# Patient Record
Sex: Female | Born: 1965 | Race: Black or African American | Hispanic: No | Marital: Single | Smoking: Never smoker
Health system: Southern US, Community
[De-identification: ages and names within clinical notes are randomized; demographics above are authoritative.]

## PROBLEM LIST (undated history)

## (undated) DIAGNOSIS — J45909 Unspecified asthma, uncomplicated: Secondary | ICD-10-CM

---

## 2019-09-23 ENCOUNTER — Encounter (HOSPITAL_COMMUNITY): Payer: Self-pay | Admitting: Obstetrics and Gynecology

## 2019-09-23 ENCOUNTER — Inpatient Hospital Stay (HOSPITAL_COMMUNITY)
Admission: EM | Admit: 2019-09-23 | Discharge: 2019-11-03 | DRG: 207 | Disposition: E | Payer: No Typology Code available for payment source | Attending: Specialist | Admitting: Specialist

## 2019-09-23 ENCOUNTER — Inpatient Hospital Stay (HOSPITAL_COMMUNITY): Payer: No Typology Code available for payment source

## 2019-09-23 ENCOUNTER — Emergency Department (HOSPITAL_COMMUNITY): Payer: No Typology Code available for payment source

## 2019-09-23 ENCOUNTER — Other Ambulatory Visit: Payer: Self-pay

## 2019-09-23 DIAGNOSIS — Z781 Physical restraint status: Secondary | ICD-10-CM

## 2019-09-23 DIAGNOSIS — I444 Left anterior fascicular block: Secondary | ICD-10-CM | POA: Diagnosis present

## 2019-09-23 DIAGNOSIS — J310 Chronic rhinitis: Secondary | ICD-10-CM | POA: Diagnosis present

## 2019-09-23 DIAGNOSIS — R7402 Elevation of levels of lactic acid dehydrogenase (LDH): Secondary | ICD-10-CM | POA: Diagnosis not present

## 2019-09-23 DIAGNOSIS — D6489 Other specified anemias: Secondary | ICD-10-CM | POA: Diagnosis present

## 2019-09-23 DIAGNOSIS — J80 Acute respiratory distress syndrome: Secondary | ICD-10-CM | POA: Diagnosis present

## 2019-09-23 DIAGNOSIS — R042 Hemoptysis: Secondary | ICD-10-CM | POA: Diagnosis not present

## 2019-09-23 DIAGNOSIS — I10 Essential (primary) hypertension: Secondary | ICD-10-CM | POA: Diagnosis present

## 2019-09-23 DIAGNOSIS — E876 Hypokalemia: Secondary | ICD-10-CM | POA: Diagnosis not present

## 2019-09-23 DIAGNOSIS — R7989 Other specified abnormal findings of blood chemistry: Secondary | ICD-10-CM | POA: Diagnosis present

## 2019-09-23 DIAGNOSIS — I469 Cardiac arrest, cause unspecified: Secondary | ICD-10-CM | POA: Diagnosis not present

## 2019-09-23 DIAGNOSIS — U071 COVID-19: Secondary | ICD-10-CM | POA: Diagnosis present

## 2019-09-23 DIAGNOSIS — E162 Hypoglycemia, unspecified: Secondary | ICD-10-CM | POA: Diagnosis not present

## 2019-09-23 DIAGNOSIS — J95860 Postprocedural hematoma of a respiratory system organ or structure following a respiratory system procedure: Secondary | ICD-10-CM | POA: Diagnosis not present

## 2019-09-23 DIAGNOSIS — D72819 Decreased white blood cell count, unspecified: Secondary | ICD-10-CM | POA: Diagnosis present

## 2019-09-23 DIAGNOSIS — I2699 Other pulmonary embolism without acute cor pulmonale: Secondary | ICD-10-CM | POA: Diagnosis not present

## 2019-09-23 DIAGNOSIS — R1310 Dysphagia, unspecified: Secondary | ICD-10-CM | POA: Diagnosis not present

## 2019-09-23 DIAGNOSIS — J9601 Acute respiratory failure with hypoxia: Secondary | ICD-10-CM

## 2019-09-23 DIAGNOSIS — I4891 Unspecified atrial fibrillation: Secondary | ICD-10-CM | POA: Diagnosis present

## 2019-09-23 DIAGNOSIS — R061 Stridor: Secondary | ICD-10-CM | POA: Diagnosis not present

## 2019-09-23 DIAGNOSIS — J45909 Unspecified asthma, uncomplicated: Secondary | ICD-10-CM | POA: Diagnosis present

## 2019-09-23 DIAGNOSIS — E1165 Type 2 diabetes mellitus with hyperglycemia: Secondary | ICD-10-CM | POA: Diagnosis not present

## 2019-09-23 DIAGNOSIS — Z6841 Body Mass Index (BMI) 40.0 and over, adult: Secondary | ICD-10-CM | POA: Diagnosis not present

## 2019-09-23 DIAGNOSIS — Z01818 Encounter for other preprocedural examination: Secondary | ICD-10-CM

## 2019-09-23 DIAGNOSIS — E119 Type 2 diabetes mellitus without complications: Secondary | ICD-10-CM | POA: Diagnosis not present

## 2019-09-23 DIAGNOSIS — Z9911 Dependence on respirator [ventilator] status: Secondary | ICD-10-CM | POA: Diagnosis not present

## 2019-09-23 DIAGNOSIS — D72829 Elevated white blood cell count, unspecified: Secondary | ICD-10-CM | POA: Diagnosis not present

## 2019-09-23 DIAGNOSIS — Y849 Medical procedure, unspecified as the cause of abnormal reaction of the patient, or of later complication, without mention of misadventure at the time of the procedure: Secondary | ICD-10-CM | POA: Diagnosis not present

## 2019-09-23 DIAGNOSIS — Z79899 Other long term (current) drug therapy: Secondary | ICD-10-CM | POA: Diagnosis not present

## 2019-09-23 DIAGNOSIS — F419 Anxiety disorder, unspecified: Secondary | ICD-10-CM | POA: Diagnosis present

## 2019-09-23 DIAGNOSIS — K219 Gastro-esophageal reflux disease without esophagitis: Secondary | ICD-10-CM | POA: Diagnosis present

## 2019-09-23 DIAGNOSIS — J1282 Pneumonia due to coronavirus disease 2019: Secondary | ICD-10-CM | POA: Diagnosis present

## 2019-09-23 DIAGNOSIS — I959 Hypotension, unspecified: Secondary | ICD-10-CM | POA: Diagnosis not present

## 2019-09-23 DIAGNOSIS — R410 Disorientation, unspecified: Secondary | ICD-10-CM | POA: Diagnosis not present

## 2019-09-23 DIAGNOSIS — E860 Dehydration: Secondary | ICD-10-CM | POA: Diagnosis present

## 2019-09-23 DIAGNOSIS — R7401 Elevation of levels of liver transaminase levels: Secondary | ICD-10-CM | POA: Diagnosis not present

## 2019-09-23 DIAGNOSIS — R0602 Shortness of breath: Secondary | ICD-10-CM | POA: Diagnosis not present

## 2019-09-23 DIAGNOSIS — J9690 Respiratory failure, unspecified, unspecified whether with hypoxia or hypercapnia: Secondary | ICD-10-CM

## 2019-09-23 DIAGNOSIS — R0902 Hypoxemia: Secondary | ICD-10-CM

## 2019-09-23 DIAGNOSIS — R7303 Prediabetes: Secondary | ICD-10-CM | POA: Diagnosis present

## 2019-09-23 DIAGNOSIS — R739 Hyperglycemia, unspecified: Secondary | ICD-10-CM | POA: Diagnosis not present

## 2019-09-23 DIAGNOSIS — Z7901 Long term (current) use of anticoagulants: Secondary | ICD-10-CM | POA: Diagnosis not present

## 2019-09-23 DIAGNOSIS — Z452 Encounter for adjustment and management of vascular access device: Secondary | ICD-10-CM

## 2019-09-23 DIAGNOSIS — R58 Hemorrhage, not elsewhere classified: Secondary | ICD-10-CM | POA: Diagnosis not present

## 2019-09-23 DIAGNOSIS — J189 Pneumonia, unspecified organism: Secondary | ICD-10-CM

## 2019-09-23 DIAGNOSIS — J9600 Acute respiratory failure, unspecified whether with hypoxia or hypercapnia: Secondary | ICD-10-CM

## 2019-09-23 DIAGNOSIS — Z8249 Family history of ischemic heart disease and other diseases of the circulatory system: Secondary | ICD-10-CM

## 2019-09-23 DIAGNOSIS — Z978 Presence of other specified devices: Secondary | ICD-10-CM

## 2019-09-23 HISTORY — DX: Unspecified asthma, uncomplicated: J45.909

## 2019-09-23 LAB — URINALYSIS, ROUTINE W REFLEX MICROSCOPIC
Bacteria, UA: NONE SEEN
Bilirubin Urine: NEGATIVE
Glucose, UA: NEGATIVE mg/dL
Ketones, ur: 5 mg/dL — AB
Leukocytes,Ua: NEGATIVE
Nitrite: NEGATIVE
Protein, ur: >=300 mg/dL — AB
Specific Gravity, Urine: 1.03 (ref 1.005–1.030)
pH: 6 (ref 5.0–8.0)

## 2019-09-23 LAB — BASIC METABOLIC PANEL
Anion gap: 12 (ref 5–15)
BUN: 15 mg/dL (ref 6–20)
CO2: 21 mmol/L — ABNORMAL LOW (ref 22–32)
Calcium: 8.2 mg/dL — ABNORMAL LOW (ref 8.9–10.3)
Chloride: 105 mmol/L (ref 98–111)
Creatinine, Ser: 0.65 mg/dL (ref 0.44–1.00)
GFR calc Af Amer: >60 mL/min (ref >60)
GFR calc non Af Amer: >60 mL/min (ref >60)
Glucose, Bld: 138 mg/dL — ABNORMAL HIGH (ref 70–99)
Potassium: 3.6 mmol/L (ref 3.5–5.1)
Sodium: 138 mmol/L (ref 135–145)

## 2019-09-23 LAB — CBC WITH DIFFERENTIAL/PLATELET
Abs Immature Granulocytes: 0.04 10*3/uL (ref 0.00–0.07)
Basophils Absolute: 0 10*3/uL (ref 0.0–0.1)
Basophils Relative: 0 %
Eosinophils Absolute: 0 10*3/uL (ref 0.0–0.5)
Eosinophils Relative: 0 %
HCT: 46.5 % — ABNORMAL HIGH (ref 36.0–46.0)
Hemoglobin: 14.5 g/dL (ref 12.0–15.0)
Immature Granulocytes: 1 %
Lymphocytes Relative: 17 %
Lymphs Abs: 1 10*3/uL (ref 0.7–4.0)
MCH: 20.9 pg — ABNORMAL LOW (ref 26.0–34.0)
MCHC: 31.2 g/dL (ref 30.0–36.0)
MCV: 67 fL — ABNORMAL LOW (ref 80.0–100.0)
Monocytes Absolute: 0.5 10*3/uL (ref 0.1–1.0)
Monocytes Relative: 8 %
Neutro Abs: 4.3 10*3/uL (ref 1.7–7.7)
Neutrophils Relative %: 74 %
Platelets: 229 10*3/uL (ref 150–400)
RBC: 6.94 MIL/uL — ABNORMAL HIGH (ref 3.87–5.11)
RDW: 18.4 % — ABNORMAL HIGH (ref 11.5–15.5)
WBC: 5.9 10*3/uL (ref 4.0–10.5)
nRBC: 0 % (ref 0.0–0.2)

## 2019-09-23 LAB — RETICULOCYTES
Immature Retic Fract: 9.5 % (ref 2.3–15.9)
RBC.: 6.63 MIL/uL — ABNORMAL HIGH (ref 3.87–5.11)
Retic Count, Absolute: 41.1 10*3/uL (ref 19.0–186.0)
Retic Ct Pct: 0.6 % (ref 0.4–3.1)

## 2019-09-23 LAB — COMPREHENSIVE METABOLIC PANEL
ALT: 46 U/L — ABNORMAL HIGH (ref 0–44)
AST: 77 U/L — ABNORMAL HIGH (ref 15–41)
Albumin: 3.3 g/dL — ABNORMAL LOW (ref 3.5–5.0)
Alkaline Phosphatase: 70 U/L (ref 38–126)
Anion gap: 16 — ABNORMAL HIGH (ref 5–15)
BUN: 17 mg/dL (ref 6–20)
CO2: 21 mmol/L — ABNORMAL LOW (ref 22–32)
Calcium: 8.7 mg/dL — ABNORMAL LOW (ref 8.9–10.3)
Chloride: 102 mmol/L (ref 98–111)
Creatinine, Ser: 0.88 mg/dL (ref 0.44–1.00)
GFR calc Af Amer: >60 mL/min (ref >60)
GFR calc non Af Amer: >60 mL/min (ref >60)
Glucose, Bld: 202 mg/dL — ABNORMAL HIGH (ref 70–99)
Potassium: 3.1 mmol/L — ABNORMAL LOW (ref 3.5–5.1)
Sodium: 139 mmol/L (ref 135–145)
Total Bilirubin: 0.6 mg/dL (ref 0.3–1.2)
Total Protein: 7.5 g/dL (ref 6.5–8.1)

## 2019-09-23 LAB — D-DIMER, QUANTITATIVE: D-Dimer, Quant: 1.08 ug/mL-FEU — ABNORMAL HIGH (ref 0.00–0.50)

## 2019-09-23 LAB — LACTIC ACID, PLASMA
Lactic Acid, Venous: 2.6 mmol/L (ref 0.5–1.9)
Lactic Acid, Venous: 1.4 mmol/L (ref 0.5–1.9)

## 2019-09-23 LAB — PROTIME-INR
INR: 1 (ref 0.8–1.2)
Prothrombin Time: 13.1 seconds (ref 11.4–15.2)

## 2019-09-23 LAB — TROPONIN I (HIGH SENSITIVITY)
Troponin I (High Sensitivity): 15 ng/L (ref <18)
Troponin I (High Sensitivity): 17 ng/L (ref <18)

## 2019-09-23 LAB — FOLATE: Folate: 28.8 ng/mL (ref >5.9)

## 2019-09-23 LAB — POC SARS CORONAVIRUS 2 AG -  ED: SARS Coronavirus 2 Ag: POSITIVE — AB

## 2019-09-23 LAB — VITAMIN B12: Vitamin B-12: 5996 pg/mL — ABNORMAL HIGH (ref 180–914)

## 2019-09-23 LAB — FERRITIN: Ferritin: 282 ng/mL (ref 11–307)

## 2019-09-23 LAB — IRON AND TIBC
Iron: 17 ug/dL — ABNORMAL LOW (ref 28–170)
Saturation Ratios: 5 % — ABNORMAL LOW (ref 10.4–31.8)
TIBC: 331 ug/dL (ref 250–450)
UIBC: 314 ug/dL

## 2019-09-23 LAB — HCG, QUANTITATIVE, PREGNANCY: hCG, Beta Chain, Quant, S: <1 m[IU]/mL (ref <5)

## 2019-09-23 LAB — HEPATITIS B SURFACE ANTIGEN: Hepatitis B Surface Ag: NONREACTIVE

## 2019-09-23 LAB — ECHOCARDIOGRAM COMPLETE
Height: 66 in
Weight: 4832 oz

## 2019-09-23 LAB — FIBRINOGEN: Fibrinogen: 602 mg/dL — ABNORMAL HIGH (ref 210–475)

## 2019-09-23 LAB — PROCALCITONIN: Procalcitonin: 5 ng/mL

## 2019-09-23 LAB — MAGNESIUM: Magnesium: 2.5 mg/dL — ABNORMAL HIGH (ref 1.7–2.4)

## 2019-09-23 LAB — LACTATE DEHYDROGENASE: LDH: 469 U/L — ABNORMAL HIGH (ref 98–192)

## 2019-09-23 LAB — T4, FREE: Free T4: 0.97 ng/dL (ref 0.61–1.12)

## 2019-09-23 LAB — HIV ANTIBODY (ROUTINE TESTING W REFLEX): HIV Screen 4th Generation wRfx: NONREACTIVE

## 2019-09-23 LAB — TRIGLYCERIDES: Triglycerides: 137 mg/dL (ref <150)

## 2019-09-23 LAB — C-REACTIVE PROTEIN: CRP: 11.3 mg/dL — ABNORMAL HIGH (ref <1.0)

## 2019-09-23 LAB — TSH: TSH: 4.108 u[IU]/mL (ref 0.350–4.500)

## 2019-09-23 MED ORDER — ZINC SULFATE 220 (50 ZN) MG PO CAPS
220.0000 mg | ORAL_CAPSULE | Freq: Every day | ORAL | Status: DC
  Administered 2019-09-23: 220 mg via ORAL
  Filled 2019-09-23: qty 1

## 2019-09-23 MED ORDER — SODIUM CHLORIDE 0.9 % IV SOLN
100.0000 mg | Freq: Every day | INTRAVENOUS | Status: AC
  Administered 2019-09-24 – 2019-09-27 (×4): 100 mg via INTRAVENOUS
  Filled 2019-09-23 (×4): qty 20

## 2019-09-23 MED ORDER — POTASSIUM CHLORIDE 10 MEQ/100ML IV SOLN
INTRAVENOUS | Status: AC
  Filled 2019-09-23: qty 100

## 2019-09-23 MED ORDER — POTASSIUM CHLORIDE 10 MEQ/100ML IV SOLN
10.0000 meq | Freq: Once | INTRAVENOUS | Status: AC
  Administered 2019-09-23: 10 meq via INTRAVENOUS
  Filled 2019-09-23: qty 100

## 2019-09-23 MED ORDER — DEXAMETHASONE SODIUM PHOSPHATE 10 MG/ML IJ SOLN: 8.0000 mg | Freq: Every day | INTRAMUSCULAR | Status: DC

## 2019-09-23 MED ORDER — TOCILIZUMAB 400 MG/20ML IV SOLN
800.0000 mg | Freq: Once | INTRAVENOUS | Status: AC
  Administered 2019-09-23: 800 mg via INTRAVENOUS
  Filled 2019-09-23: qty 40

## 2019-09-23 MED ORDER — HYDROCOD POLST-CPM POLST ER 10-8 MG/5ML PO SUER
5.0000 mL | Freq: Two times a day (BID) | ORAL | Status: DC | PRN
  Administered 2019-09-23 – 2019-09-24 (×2): 5 mL via ORAL
  Filled 2019-09-23 (×2): qty 5

## 2019-09-23 MED ORDER — ASCORBIC ACID 500 MG PO TABS
500.0000 mg | ORAL_TABLET | Freq: Two times a day (BID) | ORAL | Status: DC
  Administered 2019-09-23 (×2): 500 mg via ORAL
  Filled 2019-09-23 (×2): qty 1

## 2019-09-23 MED ORDER — CHLORHEXIDINE GLUCONATE CLOTH 2 % EX PADS
6.0000 | MEDICATED_PAD | Freq: Every day | CUTANEOUS | Status: DC
  Administered 2019-09-23 – 2019-10-06 (×12): 6 via TOPICAL

## 2019-09-23 MED ORDER — SODIUM CHLORIDE 0.9 % IV SOLN
100.0000 mg | Freq: Once | INTRAVENOUS | Status: AC
  Administered 2019-09-23: 100 mg via INTRAVENOUS
  Filled 2019-09-23: qty 20

## 2019-09-23 MED ORDER — LORAZEPAM 2 MG/ML IJ SOLN
0.5000 mg | Freq: Four times a day (QID) | INTRAMUSCULAR | Status: DC | PRN
  Administered 2019-09-23 (×2): 0.5 mg via INTRAVENOUS
  Filled 2019-09-23 (×2): qty 1

## 2019-09-23 MED ORDER — DILTIAZEM HCL-DEXTROSE 125-5 MG/125ML-% IV SOLN (PREMIX)
5.0000 mg/h | INTRAVENOUS | Status: DC
  Administered 2019-09-23: 5 mg/h via INTRAVENOUS
  Administered 2019-09-24: 7.5 mg/h via INTRAVENOUS
  Administered 2019-09-24: 10 mg/h via INTRAVENOUS
  Administered 2019-09-25: 5 mg/h via INTRAVENOUS
  Filled 2019-09-23 (×6): qty 125

## 2019-09-23 MED ORDER — SODIUM CHLORIDE 0.9 % IV SOLN: 100.0000 mg | Freq: Every day | INTRAVENOUS | Status: DC

## 2019-09-23 MED ORDER — IPRATROPIUM-ALBUTEROL 20-100 MCG/ACT IN AERS
1.0000 | INHALATION_SPRAY | Freq: Four times a day (QID) | RESPIRATORY_TRACT | Status: DC
  Administered 2019-09-23 (×2): 1 via RESPIRATORY_TRACT
  Filled 2019-09-23: qty 4

## 2019-09-23 MED ORDER — ACETAMINOPHEN 325 MG PO TABS
650.0000 mg | ORAL_TABLET | Freq: Four times a day (QID) | ORAL | Status: DC | PRN
  Administered 2019-09-23 – 2019-10-04 (×2): 650 mg via ORAL
  Filled 2019-09-23 (×2): qty 2

## 2019-09-23 MED ORDER — DEXAMETHASONE SODIUM PHOSPHATE 10 MG/ML IJ SOLN
6.0000 mg | Freq: Every day | INTRAMUSCULAR | Status: AC
  Administered 2019-09-23 – 2019-10-02 (×10): 6 mg via INTRAVENOUS
  Filled 2019-09-23 (×10): qty 1

## 2019-09-23 MED ORDER — ONDANSETRON HCL 4 MG/2ML IJ SOLN: 4.0000 mg | Freq: Four times a day (QID) | INTRAMUSCULAR | Status: DC | PRN

## 2019-09-23 MED ORDER — POTASSIUM CHLORIDE IN NACL 40-0.9 MEQ/L-% IV SOLN
INTRAVENOUS | Status: DC
  Administered 2019-09-23: 125 mL/h via INTRAVENOUS
  Filled 2019-09-23 (×2): qty 1000

## 2019-09-23 MED ORDER — PANTOPRAZOLE SODIUM 40 MG IV SOLR
40.0000 mg | Freq: Every day | INTRAVENOUS | Status: DC
  Administered 2019-09-23: 40 mg via INTRAVENOUS
  Filled 2019-09-23: qty 40

## 2019-09-23 MED ORDER — SODIUM CHLORIDE 0.9 % IV SOLN: 200.0000 mg | Freq: Once | INTRAVENOUS | Status: DC

## 2019-09-23 MED ORDER — APIXABAN 5 MG PO TABS
5.0000 mg | ORAL_TABLET | Freq: Two times a day (BID) | ORAL | Status: DC
  Administered 2019-09-23: 5 mg via ORAL
  Filled 2019-09-23 (×2): qty 1

## 2019-09-23 MED ORDER — ADULT MULTIVITAMIN W/MINERALS CH
1.0000 | ORAL_TABLET | Freq: Every day | ORAL | Status: DC
  Administered 2019-09-23: 1 via ORAL
  Filled 2019-09-23: qty 1

## 2019-09-23 MED ORDER — ONDANSETRON HCL 4 MG PO TABS: 4.0000 mg | ORAL_TABLET | Freq: Four times a day (QID) | ORAL | Status: DC | PRN

## 2019-09-23 MED ORDER — SODIUM CHLORIDE 0.9 % IV SOLN
1000.0000 mL | INTRAVENOUS | Status: DC
  Administered 2019-09-23: 1000 mL via INTRAVENOUS

## 2019-09-23 MED ORDER — GUAIFENESIN-DM 100-10 MG/5ML PO SYRP
10.0000 mL | ORAL_SOLUTION | ORAL | Status: DC | PRN
  Administered 2019-09-23 – 2019-09-24 (×3): 10 mL via ORAL
  Filled 2019-09-23 (×4): qty 10

## 2019-09-23 NOTE — ED Provider Notes (Signed)
[facility]-EMERGENCY DEPT Provider Note   CSN: [id number] Arrival date & time: 09-30-2019  0847     History Chief Complaint  Patient presents with  . Shortness of Breath    COVID +    Summer Guzman is a 54 y.o. female.  54 year old female with history of asthma presents with increasing shortness of breath.  Patient diagnosed with Covid yesterday at an outpatient pharmacy and started having more trouble breathing today.  She has had vomiting and diarrhea.  Loss of taste.  Has had wheezing and attempted to use her albuterol inhaler without relief.  EMS was called and patient's pulse oximetry was 70% she was placed nonrebreather and pulse oximetry came up to 90%.  Transported here for further management        No past medical history on file.  There are no problems to display for this patient.      OB History   No obstetric history on file.     No family history on file.  Social History   Tobacco Use  . Smoking status: Not on file  Substance Use Topics  . Alcohol use: Not on file  . Drug use: Not on file    Home Medications Prior to Admission medications   Not on File    Allergies    Patient has no allergy information on record.  Review of Systems   Review of Systems  All other systems reviewed and are negative.   Physical Exam Updated Vital Signs BP 104/61 (BP Location: Left Arm)   Pulse 98   Temp (!) 97.4 F (36.3 C) (Oral)   Resp (!) 21   Ht 1.676 m (5\' 6" )   Wt (!) 137 kg   SpO2 91%   BMI 48.74 kg/m   Physical Exam Vitals and nursing note reviewed.  Constitutional:      General: She is not in acute distress.    Appearance: Normal appearance. She is well-developed. She is not toxic-appearing.  HENT:     Head: Normocephalic and atraumatic.  Eyes:     General: Lids are normal.     Conjunctiva/sclera: Conjunctivae normal.     Pupils: Pupils are equal, round, and reactive to light.  Neck:     Thyroid: No thyroid mass.       Trachea: No tracheal deviation.  Cardiovascular:     Rate and Rhythm: Normal rate and regular rhythm.     Heart sounds: Normal heart sounds. No murmur. No gallop.   Pulmonary:     Effort: Tachypnea present. No respiratory distress.     Breath sounds: No stridor. Decreased breath sounds and wheezing present. No rhonchi or rales.  Abdominal:     General: Bowel sounds are normal. There is no distension.     Palpations: Abdomen is soft.     Tenderness: There is no abdominal tenderness. There is no rebound.  Musculoskeletal:        General: No tenderness. Normal range of motion.     Cervical back: Normal range of motion and neck supple.  Skin:    General: Skin is warm and dry.     Findings: No abrasion or rash.  Neurological:     Mental Status: She is alert and oriented to person, place, and time.     GCS: GCS eye subscore is 4. GCS verbal subscore is 5. GCS motor subscore is 6.     Cranial Nerves: No cranial nerve deficit.     Sensory: No  sensory deficit.  Psychiatric:        Attention and Perception: Attention normal.        Speech: Speech normal.        Behavior: Behavior normal.     ED Results / Procedures / Treatments   Labs (all labs ordered are listed, but only abnormal results are displayed) Labs Reviewed  CULTURE, BLOOD (ROUTINE X 2)  CULTURE, BLOOD (ROUTINE X 2)  COMPREHENSIVE METABOLIC PANEL  LACTIC ACID, PLASMA  LACTIC ACID, PLASMA  CBC WITH DIFFERENTIAL/PLATELET  PROTIME-INR  URINALYSIS, ROUTINE W REFLEX MICROSCOPIC  D-DIMER, QUANTITATIVE (NOT AT [facility])  PROCALCITONIN  LACTATE DEHYDROGENASE  FERRITIN  TRIGLYCERIDES  FIBRINOGEN  C-REACTIVE PROTEIN  I-STAT BETA HCG BLOOD, ED ([facility], [facility], [facility] ONLY)  I-STAT BETA HCG BLOOD, ED ([facility], [facility], [facility] ONLY)  POC SARS CORONAVIRUS 2 AG -  ED    EKG EKG Interpretation  Date/Time:  10/13/2019 09:07:06 EDT Ventricular Rate:  151 PR Interval:    QRS Duration: 109 QT Interval:  310 QTC Calculation: 492 R  Axis:   -120 Text Interpretation: Atrial fibrillation Left anterior fascicular block Low voltage, precordial leads Probable anteroseptal infarct, old No old tracing to compare Confirmed by Lacretia Leigh ([id number]) on 10-13-19 9:33:23 AM   Radiology No results found.  Procedures Procedures (including critical care time)  Medications Ordered in ED Medications  0.9 %  sodium chloride infusion (has no administration in time range)    ED Course  I have reviewed the triage vital signs and the nursing notes.  Pertinent labs & imaging results that were available during my care of the patient were reviewed by me and considered in my medical decision making (see chart for details).    MDM Rules/Calculators/A&P                      Patient here with COVID-19 infection.  Patient on nonrebreather for oxygen and is breathing well.  Chest x-ray consistent with that she does have infiltrates.  She is tachycardic here in atrial fibrillation was started on Cardizem drip and heart rate has responded to that after it was titrated.  Lactate elevated 2.6.  Will not give sepsis bolus as she is Covid positive.  Mild hypokalemia noted at 3.1 and given potassium 10 mEq.  Will admit to the hospitalist service  CRITICAL CARE Performed by: Leota Jacobsen Total critical care time: 50 minutes Critical care time was exclusive of separately billable procedures and treating other patients. Critical care was necessary to treat or prevent imminent or life-threatening deterioration. Critical care was time spent personally by me on the following activities: development of treatment plan with patient and/or surrogate as well as nursing, discussions with consultants, evaluation of patient's response to treatment, examination of patient, obtaining history from patient or surrogate, ordering and performing treatments and interventions, ordering and review of laboratory studies, ordering and review of radiographic studies,  pulse oximetry and re-evaluation of patient's condition.  Final Clinical Impression(s) / ED Diagnoses Final diagnoses:  [id number]    Rx / DC Orders ED Discharge Orders    None       Lacretia Leigh, MD 10-13-19 1036

## 2019-09-23 NOTE — ED Triage Notes (Signed)
Pt is reporting from home with a diagnosis of COVID 19. Patient reports she was diagnosed on Tuesday and has been having SOB and body aches. Patient reports a hx of asthma. Patient reports anxiety as her aunt died of COVID.

## 2019-09-23 NOTE — Progress Notes (Signed)
Rt called to room WA18 for Covid pt. Rt placed pt on heated high flow 100%/30L with 100%. Pt sats 94%. Rt will continue to monitor.

## 2019-09-23 NOTE — ED Notes (Signed)
Lactic 2.6, MD made aware. Fluids already going for patient

## 2019-09-23 NOTE — H&P (Addendum)
History and Physical    Summer Guzman  YSA:[id number]  DOB: 1966/04/12  DOA: September 27, 2019 PCP: Patient, No Pcp Per   Patient coming from: home  Chief Complaint: shortness of breath  HPI: Summer Guzman is a 54 y.o. female with medical history of asthma who moved here from [place] recently for a job promotion. For 1 week now she has been having shortness of breath (since last Sunday). She tested positive for COVID on Tuesday and has become progressively more short of breath with exertion. She has a cough, nausea, vomiting, diarrhea, weakness and body aches (especially her legs). No wheezing but her inhaler did not hel p her dyspnea.  She is not coughing up sputum. No blood in vomitus or stool. She thinks she contracted COVID from her niece who has not been careful with COVID precautions. Her niece has been tested once and was negative but did not re-test. She is currently in [facility].  Noted to be in A-fib with RVR in ED. No prior history of this. No palpitations  ED Course: RR in 30-40s, HR in 120s, BP 104/61, temp 97.4, pulse ox in low 80s on room air.  - EKG  > A -fib with RVR-  K 3.1, CO2 21, Glucose 201 LDH 469 Ferritin 282 CRP 11.3, Lactic acid 2.6, Procalcitonin 5.0, D dimer 1.08, Fibrinogen 602  Review of Systems:  All other systems reviewed and apart from HPI, are negative.  Past Medical History:  Diagnosis Date  . Asthma     Past Surgical History:  Procedure Laterality Date  . CESAREAN SECTION      Social History:   reports that she has never smoked. She has never used smokeless tobacco. She reports previous alcohol use. She reports previous drug use.  No Known Allergies  Family history : HTN   Prior to Admission medications   Medication Sig Start Date End Date Taking? Authorizing Provider  albuterol (VENTOLIN HFA) 108 (90 Base) MCG/ACT inhaler Inhale 1-2 puffs into the lungs every 4 (four) hours as needed for wheezing or shortness of breath.   Yes [provider]  b complex vitamins capsule Take 1 capsule by mouth daily.   Yes [provider]  BIOTIN PO Take 1 tablet by mouth daily.   Yes [provider]  Cholecalciferol (VITAMIN D3 PO) Take 1 tablet by mouth daily.   Yes [provider]  Cyanocobalamin (VITAMIN B-12 PO) Take 1 tablet by mouth daily.   Yes [provider]  Multiple Vitamins-Minerals (IMMUNE SYSTEM BOOSTER PO) Take 1 capsule by mouth daily.   Yes [provider]  Omega-3 Fatty Acids (FISH OIL PO) Take 1 capsule by mouth daily.   Yes [provider]  VITAMIN A PO Take 1 tablet by mouth daily.   Yes [provider]    Physical Exam: Wt Readings from Last 3 Encounters:  Sep 27, 2019 (!) 137 kg   Vitals:   Sep 27, 2019 1200 09/27/2019 1218 09-27-19 1219 09/27/19 1308  BP: 115/72   112/61  Pulse: 99 (!) 53 (!) 39 (!) 111  Resp: (!) 37 (!) 48 (!) 43 (!) 35  Temp:      TempSrc:      SpO2: 96% 93% 96% 93%  Weight:      Height:          Constitutional:  Calm & comfortable Eyes: PERRLA, lids and conjunctivae normal ENT:  Mucous membranes are dry Pharynx clear of exudate   Normal dentition.  Neck: Supple, no  masses  Respiratory:  Coarse crackles at bases- appears short of breath with tachypnea   Cardiovascular:  S1 & S2 heard, irregular rate and rhythm- tachycardia No Murmurs Abdomen:  Non distended No tenderness, No masses Bowel sounds normal Extremities:  No clubbing / cyanosis No pedal edema No joint deformity    Skin:  No rashes, lesions or ulcers Neurologic:  AAO x 3 CN 2-12 grossly intact Sensation intact Strength 5/5 in all 4 extremities Psychiatric:  Normal Mood and affect    Labs on Admission: I have personally reviewed following labs and imaging studies  CBC: Recent Labs  Lab 09/04/2019 0908  WBC 5.9  NEUTROABS 4.3  HGB 14.5  HCT 46.5*  MCV 67.0*  PLT 229   Basic Metabolic Panel: Recent Labs  Lab 10/01/2019 0908  NA 139   K 3.1*  CL 102  CO2 21*  GLUCOSE 202*  BUN 17  CREATININE 0.88  CALCIUM 8.7*   GFR: Estimated Creatinine Clearance: 106.7 mL/min (by C-G formula based on SCr of 0.88 mg/dL). Liver Function Tests: Recent Labs  Lab 09/20/2019 0908  AST 77*  ALT 46*  ALKPHOS 70  BILITOT 0.6  PROT 7.5  ALBUMIN 3.3*   No results for input(s): LIPASE, AMYLASE in the last 168 hours. No results for input(s): AMMONIA in the last 168 hours. Coagulation Profile: Recent Labs  Lab 09/09/2019 0908  INR 1.0   Cardiac Enzymes: No results for input(s): CKTOTAL, CKMB, CKMBINDEX, TROPONINI in the last 168 hours. BNP (last 3 results) No results for input(s): PROBNP in the last 8760 hours. HbA1C: No results for input(s): HGBA1C in the last 72 hours. CBG: No results for input(s): GLUCAP in the last 168 hours. Lipid Profile: Recent Labs    09/11/2019 0908  TRIG 137   Thyroid Function Tests: Recent Labs    09/29/2019 1049  TSH 4.108   Anemia Panel: Recent Labs    09/05/2019 0908  FERRITIN 282   Urine analysis: No results found for: COLORURINE, APPEARANCEUR, LABSPEC, PHURINE, GLUCOSEU, HGBUR, BILIRUBINUR, KETONESUR, PROTEINUR, UROBILINOGEN, NITRITE, LEUKOCYTESUR Sepsis Labs: @LABRCNTIP (procalcitonin:4,lacticidven:4) )No results found for this or any previous visit (from the past 240 hour(s)).   Radiological Exams on Admission: DG Chest Port 1 View  Result Date: 10/02/2019 CLINICAL DATA:  Patient reporting from home with a diagnosis of COVID-19. EXAM: PORTABLE CHEST 1 VIEW COMPARISON:  None. FINDINGS: Bilateral pulmonary infiltrates are identified consistent with the reported history of COVID-19. Mild cardiomegaly. The hila and mediastinum are unremarkable given portable technique. No pneumothorax. No other acute abnormalities. IMPRESSION: Bilateral pulmonary infiltrates, particularly in the bases, consistent with the patient's COVID-19 status. Electronically Signed   By: 09/25/2019 III M.D   On:  09/26/2019 09:44    EKG: Independently reviewed. A-fib at 121  Assessment/Plan Principal Problem:   Acute respiratory failure due to COVID-19 pneumonia with vomiting, diarrhea, dehydration and elevated LFTs - date of onset of symptoms> 3/14 - date of positive test> 3/16 - start IV steroid, Remdesivir,combivent, Vit C, Zinc and Zofran- give Protonix for GI protection - cont O2 to keep pulse ox > 90% - prone as much as possible - aggressively hydrate, follow I and O  Addendum: have ordered Actemra- discussed risk/ benefits along with the of label use for COVID 19 infections with patient who agrees with taking the medication- she has no current contraindications for it- does not admit to activie hepatitis, TB, other immune illness or recent use of immunosupressive drugs.  Active Problems:  New onset a-fib  - cont Cardizem infusion for tonight and attempt to transition off tomorrow - obtain 2 D ECHO and thyroid studies - start Eliquis Addendum: ECHO Reviewed> hyperdynamic LV- EF 70%, indeterminate diastolic function  Hypokalemia - replace via IV- likely due to diarrhea- check Mg  Microcytosis - Hb is 14.5 but MCV is 67- will obtain anemia panel    Obesity, Class III, BMI 40-49.9 (morbid obesity) (HCC)  Body mass index is 48.74 kg/m.    DVT prophylaxis: Eliquis  Code Status: Full code  Family Communication:   Disposition Plan: from home  Consults called: none  Admission status: inpatient    Calvert Cantor MD [facility] Pager: [email or web address] Password [email or web address] 7PM-7AM, please contact night-coverage   Oct 20, 2019, 2:57 PM

## 2019-09-23 NOTE — Progress Notes (Signed)
  Echocardiogram 2D Echocardiogram has been performed.  Roosvelt Maser F 09/12/2019, 1:43 PM

## 2019-09-23 NOTE — ED Notes (Signed)
At 10:35, RN noted that patient's BP had dropped. RN changed the Cardizem drip to 7.5mg /hr

## 2019-09-23 NOTE — ED Notes (Signed)
Lauris Chroman (daughter) [phone]

## 2019-09-23 NOTE — ED Notes (Signed)
Zina updated on plan of care. Family reports understanding at this time

## 2019-09-24 ENCOUNTER — Inpatient Hospital Stay (HOSPITAL_COMMUNITY): Payer: No Typology Code available for payment source

## 2019-09-24 DIAGNOSIS — J9600 Acute respiratory failure, unspecified whether with hypoxia or hypercapnia: Secondary | ICD-10-CM

## 2019-09-24 DIAGNOSIS — U071 COVID-19: Principal | ICD-10-CM

## 2019-09-24 DIAGNOSIS — J9601 Acute respiratory failure with hypoxia: Secondary | ICD-10-CM

## 2019-09-24 LAB — COMPREHENSIVE METABOLIC PANEL
ALT: 40 U/L (ref 0–44)
AST: 61 U/L — ABNORMAL HIGH (ref 15–41)
Albumin: 2.7 g/dL — ABNORMAL LOW (ref 3.5–5.0)
Alkaline Phosphatase: 63 U/L (ref 38–126)
Anion gap: 10 (ref 5–15)
BUN: 17 mg/dL (ref 6–20)
CO2: 20 mmol/L — ABNORMAL LOW (ref 22–32)
Calcium: 8.3 mg/dL — ABNORMAL LOW (ref 8.9–10.3)
Chloride: 109 mmol/L (ref 98–111)
Creatinine, Ser: 0.62 mg/dL (ref 0.44–1.00)
GFR calc Af Amer: >60 mL/min (ref >60)
GFR calc non Af Amer: >60 mL/min (ref >60)
Glucose, Bld: 170 mg/dL — ABNORMAL HIGH (ref 70–99)
Potassium: 4.1 mmol/L (ref 3.5–5.1)
Sodium: 139 mmol/L (ref 135–145)
Total Bilirubin: 0.5 mg/dL (ref 0.3–1.2)
Total Protein: 6.5 g/dL (ref 6.5–8.1)

## 2019-09-24 LAB — CBC WITH DIFFERENTIAL/PLATELET
Abs Immature Granulocytes: 0.04 10*3/uL (ref 0.00–0.07)
Basophils Absolute: 0 10*3/uL (ref 0.0–0.1)
Basophils Relative: 1 %
Eosinophils Absolute: 0 10*3/uL (ref 0.0–0.5)
Eosinophils Relative: 0 %
HCT: 43.5 % (ref 36.0–46.0)
Hemoglobin: 13.3 g/dL (ref 12.0–15.0)
Immature Granulocytes: 2 %
Lymphocytes Relative: 30 %
Lymphs Abs: 0.7 10*3/uL (ref 0.7–4.0)
MCH: 20.6 pg — ABNORMAL LOW (ref 26.0–34.0)
MCHC: 30.6 g/dL (ref 30.0–36.0)
MCV: 67.3 fL — ABNORMAL LOW (ref 80.0–100.0)
Monocytes Absolute: 0.2 10*3/uL (ref 0.1–1.0)
Monocytes Relative: 8 %
Neutro Abs: 1.3 10*3/uL — ABNORMAL LOW (ref 1.7–7.7)
Neutrophils Relative %: 59 %
Platelets: 236 10*3/uL (ref 150–400)
RBC: 6.46 MIL/uL — ABNORMAL HIGH (ref 3.87–5.11)
RDW: 18.1 % — ABNORMAL HIGH (ref 11.5–15.5)
WBC: 2.2 10*3/uL — ABNORMAL LOW (ref 4.0–10.5)
nRBC: 0 % (ref 0.0–0.2)

## 2019-09-24 LAB — BLOOD GAS, ARTERIAL
Acid-base deficit: 2.7 mmol/L — ABNORMAL HIGH (ref 0.0–2.0)
Acid-base deficit: 3.1 mmol/L — ABNORMAL HIGH (ref 0.0–2.0)
Bicarbonate: 21.8 mmol/L (ref 20.0–28.0)
Bicarbonate: 22.2 mmol/L (ref 20.0–28.0)
Drawn by: 25770
FIO2: 100
MECHVT: 470 mL
O2 Saturation: 83.4 %
O2 Saturation: 85.5 %
Patient temperature: 98.6
Patient temperature: 98.6
RATE: 24 resp/min
pCO2 arterial: 39.3 mmHg (ref 32.0–48.0)
pCO2 arterial: 42.7 mmHg (ref 32.0–48.0)
pH, Arterial: 7.363 (ref 7.350–7.450)
pH, Arterial: 7.335 — ABNORMAL LOW (ref 7.350–7.450)
pO2, Arterial: 54.5 mmHg — ABNORMAL LOW (ref 83.0–108.0)
pO2, Arterial: 58.6 mmHg — ABNORMAL LOW (ref 83.0–108.0)

## 2019-09-24 LAB — MAGNESIUM
Magnesium: 2.6 mg/dL — ABNORMAL HIGH (ref 1.7–2.4)
Magnesium: 2.6 mg/dL — ABNORMAL HIGH (ref 1.7–2.4)

## 2019-09-24 LAB — GLUCOSE, CAPILLARY
Glucose-Capillary: 173 mg/dL — ABNORMAL HIGH (ref 70–99)
Glucose-Capillary: 176 mg/dL — ABNORMAL HIGH (ref 70–99)
Glucose-Capillary: 214 mg/dL — ABNORMAL HIGH (ref 70–99)
Glucose-Capillary: 185 mg/dL — ABNORMAL HIGH (ref 70–99)

## 2019-09-24 LAB — HEMOGLOBIN A1C
Hgb A1c MFr Bld: 6.4 % — ABNORMAL HIGH (ref 4.8–5.6)
Mean Plasma Glucose: 136.98 mg/dL

## 2019-09-24 LAB — FERRITIN: Ferritin: 216 ng/mL (ref 11–307)

## 2019-09-24 LAB — C-REACTIVE PROTEIN: CRP: 10.2 mg/dL — ABNORMAL HIGH (ref <1.0)

## 2019-09-24 LAB — D-DIMER, QUANTITATIVE: D-Dimer, Quant: 1.18 ug/mL-FEU — ABNORMAL HIGH (ref 0.00–0.50)

## 2019-09-24 LAB — PHOSPHORUS
Phosphorus: 2.4 mg/dL — ABNORMAL LOW (ref 2.5–4.6)
Phosphorus: 3.1 mg/dL (ref 2.5–4.6)

## 2019-09-24 LAB — MRSA PCR SCREENING: MRSA by PCR: NEGATIVE

## 2019-09-24 MED ORDER — FREE WATER
200.0000 mL | Status: DC
  Administered 2019-09-24 – 2019-09-29 (×30): 200 mL

## 2019-09-24 MED ORDER — PRO-STAT SUGAR FREE PO LIQD
30.0000 mL | Freq: Two times a day (BID) | ORAL | Status: DC
  Administered 2019-09-24: 30 mL
  Filled 2019-09-24: qty 30

## 2019-09-24 MED ORDER — MIDAZOLAM HCL 2 MG/2ML IJ SOLN
2.0000 mg | Freq: Once | INTRAMUSCULAR | Status: AC
  Administered 2019-09-24: 2 mg via INTRAVENOUS

## 2019-09-24 MED ORDER — ASCORBIC ACID 500 MG PO TABS
500.0000 mg | ORAL_TABLET | Freq: Two times a day (BID) | ORAL | Status: DC
  Administered 2019-09-24 – 2019-10-09 (×27): 500 mg
  Filled 2019-09-24 (×28): qty 1

## 2019-09-24 MED ORDER — VANCOMYCIN HCL 10 G IV SOLR
2500.0000 mg | Freq: Once | INTRAVENOUS | Status: DC
  Filled 2019-09-24: qty 2500

## 2019-09-24 MED ORDER — SODIUM CHLORIDE 0.9 % IV SOLN
2.0000 g | Freq: Three times a day (TID) | INTRAVENOUS | Status: DC
  Administered 2019-09-24: 2 g via INTRAVENOUS
  Filled 2019-09-24: qty 2

## 2019-09-24 MED ORDER — ORAL CARE MOUTH RINSE
15.0000 mL | OROMUCOSAL | Status: DC
  Administered 2019-09-24 – 2019-10-04 (×94): 15 mL via OROMUCOSAL

## 2019-09-24 MED ORDER — FENTANYL BOLUS VIA INFUSION
50.0000 ug | INTRAVENOUS | Status: DC | PRN
  Administered 2019-09-24 – 2019-09-30 (×14): 50 ug via INTRAVENOUS
  Filled 2019-09-24: qty 50

## 2019-09-24 MED ORDER — PHENYLEPHRINE 40 MCG/ML (10ML) SYRINGE FOR IV PUSH (FOR BLOOD PRESSURE SUPPORT)
PREFILLED_SYRINGE | INTRAVENOUS | Status: AC
  Filled 2019-09-24: qty 10

## 2019-09-24 MED ORDER — FENTANYL CITRATE (PF) 100 MCG/2ML IJ SOLN
INTRAMUSCULAR | Status: AC
  Administered 2019-09-24: 100 ug via INTRAVENOUS
  Filled 2019-09-24: qty 4

## 2019-09-24 MED ORDER — ETOMIDATE 2 MG/ML IV SOLN
20.0000 mg | Freq: Once | INTRAVENOUS | Status: AC
  Administered 2019-09-24: 20 mg via INTRAVENOUS

## 2019-09-24 MED ORDER — VITAL HIGH PROTEIN PO LIQD
1000.0000 mL | ORAL | Status: DC
  Administered 2019-09-24 – 2019-10-01 (×11): 1000 mL

## 2019-09-24 MED ORDER — MIDAZOLAM HCL 2 MG/2ML IJ SOLN: 2.0000 mg | Freq: Once | INTRAMUSCULAR | Status: AC

## 2019-09-24 MED ORDER — PROPOFOL 1000 MG/100ML IV EMUL
INTRAVENOUS | Status: AC
  Administered 2019-09-24: 5 ug/kg/min via INTRAVENOUS
  Filled 2019-09-24: qty 100

## 2019-09-24 MED ORDER — CISATRACURIUM BESYLATE (PF) 10 MG/5ML IV SOLN
10.0000 mg | Freq: Once | INTRAVENOUS | Status: AC
  Administered 2019-09-24: 10 mg via INTRAVENOUS
  Filled 2019-09-24: qty 20
  Filled 2019-09-24: qty 5

## 2019-09-24 MED ORDER — MIDAZOLAM HCL 2 MG/2ML IJ SOLN
2.0000 mg | INTRAMUSCULAR | Status: AC | PRN
  Administered 2019-09-24 – 2019-09-26 (×3): 2 mg via INTRAVENOUS
  Filled 2019-09-24 (×4): qty 2

## 2019-09-24 MED ORDER — ETOMIDATE 2 MG/ML IV SOLN: 20.0000 mg | Freq: Once | INTRAVENOUS | Status: AC

## 2019-09-24 MED ORDER — ZINC SULFATE 220 (50 ZN) MG PO CAPS
220.0000 mg | ORAL_CAPSULE | Freq: Every day | ORAL | Status: DC
  Filled 2019-09-24: qty 1

## 2019-09-24 MED ORDER — PANTOPRAZOLE SODIUM 40 MG PO TBEC: 40.0000 mg | DELAYED_RELEASE_TABLET | Freq: Every day | ORAL | Status: DC

## 2019-09-24 MED ORDER — PROPOFOL 1000 MG/100ML IV EMUL
0.0000 ug/kg/min | INTRAVENOUS | Status: DC
  Administered 2019-09-24: 25 ug/kg/min via INTRAVENOUS
  Administered 2019-09-24 (×2): 35 ug/kg/min via INTRAVENOUS
  Administered 2019-09-24: 40 ug/kg/min via INTRAVENOUS
  Administered 2019-09-24: 35 ug/kg/min via INTRAVENOUS
  Administered 2019-09-25 – 2019-09-26 (×10): 40 ug/kg/min via INTRAVENOUS
  Administered 2019-09-26: 35 ug/kg/min via INTRAVENOUS
  Administered 2019-09-26: 40 ug/kg/min via INTRAVENOUS
  Administered 2019-09-26: 50 ug/kg/min via INTRAVENOUS
  Administered 2019-09-26: 40 ug/kg/min via INTRAVENOUS
  Administered 2019-09-27: 35 ug/kg/min via INTRAVENOUS
  Administered 2019-09-27: 50 ug/kg/min via INTRAVENOUS
  Administered 2019-09-27: 30 ug/kg/min via INTRAVENOUS
  Administered 2019-09-27 (×2): 35 ug/kg/min via INTRAVENOUS
  Administered 2019-09-28: 50 ug/kg/min via INTRAVENOUS
  Administered 2019-09-28: 40 ug/kg/min via INTRAVENOUS
  Administered 2019-09-28 (×6): 50 ug/kg/min via INTRAVENOUS
  Administered 2019-09-29 (×2): 40 ug/kg/min via INTRAVENOUS
  Administered 2019-09-29: 30 ug/kg/min via INTRAVENOUS
  Administered 2019-09-29 (×2): 50 ug/kg/min via INTRAVENOUS
  Administered 2019-09-29 – 2019-09-30 (×2): 40 ug/kg/min via INTRAVENOUS
  Administered 2019-09-30: 21:00:00 50 ug/kg/min via INTRAVENOUS
  Administered 2019-09-30 (×2): 40 ug/kg/min via INTRAVENOUS
  Administered 2019-09-30: 50 ug/kg/min via INTRAVENOUS
  Administered 2019-09-30: 49.988 ug/kg/min via INTRAVENOUS
  Administered 2019-09-30: 50 ug/kg/min via INTRAVENOUS
  Administered 2019-09-30: 04:00:00 40 ug/kg/min via INTRAVENOUS
  Administered 2019-09-30 – 2019-10-01 (×2): 50 ug/kg/min via INTRAVENOUS
  Administered 2019-10-01: 30 ug/kg/min via INTRAVENOUS
  Administered 2019-10-01 (×2): 40 ug/kg/min via INTRAVENOUS
  Administered 2019-10-01: 50 ug/kg/min via INTRAVENOUS
  Administered 2019-10-01: 40 ug/kg/min via INTRAVENOUS
  Administered 2019-10-02: 20 ug/kg/min via INTRAVENOUS
  Administered 2019-10-02 (×3): 40 ug/kg/min via INTRAVENOUS
  Administered 2019-10-02: 14:00:00 20 ug/kg/min via INTRAVENOUS
  Administered 2019-10-02: 30 ug/kg/min via INTRAVENOUS
  Administered 2019-10-03: 07:00:00 10 ug/kg/min via INTRAVENOUS
  Administered 2019-10-03: 16 ug/kg/min via INTRAVENOUS
  Filled 2019-09-24 (×44): qty 100
  Filled 2019-09-24: qty 200
  Filled 2019-09-24 (×19): qty 100
  Filled 2019-09-24: qty 200
  Filled 2019-09-24: qty 100

## 2019-09-24 MED ORDER — PANTOPRAZOLE SODIUM 40 MG PO TBEC: 40.0000 mg | DELAYED_RELEASE_TABLET | Freq: Two times a day (BID) | ORAL | Status: DC

## 2019-09-24 MED ORDER — FENTANYL CITRATE (PF) 100 MCG/2ML IJ SOLN: 50.0000 ug | Freq: Once | INTRAMUSCULAR | Status: DC

## 2019-09-24 MED ORDER — SODIUM CHLORIDE (PF) 0.9 % IJ SOLN
INTRAMUSCULAR | Status: AC
  Filled 2019-09-24: qty 50

## 2019-09-24 MED ORDER — FUROSEMIDE 10 MG/ML IJ SOLN
40.0000 mg | Freq: Once | INTRAMUSCULAR | Status: AC
  Administered 2019-09-24: 40 mg via INTRAVENOUS
  Filled 2019-09-24: qty 4

## 2019-09-24 MED ORDER — MIDAZOLAM HCL 2 MG/2ML IJ SOLN
2.0000 mg | INTRAMUSCULAR | Status: DC | PRN
  Administered 2019-09-27 – 2019-10-04 (×8): 2 mg via INTRAVENOUS
  Filled 2019-09-24 (×9): qty 2

## 2019-09-24 MED ORDER — VANCOMYCIN HCL 1250 MG/250ML IV SOLN: 1250.0000 mg | Freq: Two times a day (BID) | INTRAVENOUS | Status: DC

## 2019-09-24 MED ORDER — MIDAZOLAM HCL 2 MG/2ML IJ SOLN
INTRAMUSCULAR | Status: AC
  Administered 2019-09-24: 2 mg via INTRAVENOUS
  Filled 2019-09-24: qty 4

## 2019-09-24 MED ORDER — IPRATROPIUM-ALBUTEROL 0.5-2.5 (3) MG/3ML IN SOLN
3.0000 mL | Freq: Four times a day (QID) | RESPIRATORY_TRACT | Status: DC
  Administered 2019-09-24 – 2019-09-30 (×22): 3 mL via RESPIRATORY_TRACT
  Filled 2019-09-24 (×21): qty 3

## 2019-09-24 MED ORDER — CHLORHEXIDINE GLUCONATE 0.12% ORAL RINSE (MEDLINE KIT)
15.0000 mL | Freq: Two times a day (BID) | OROMUCOSAL | Status: DC
  Administered 2019-09-24 – 2019-10-03 (×19): 15 mL via OROMUCOSAL

## 2019-09-24 MED ORDER — PRO-STAT SUGAR FREE PO LIQD
30.0000 mL | Freq: Three times a day (TID) | ORAL | Status: DC
  Administered 2019-09-24 – 2019-10-03 (×26): 30 mL
  Filled 2019-09-24 (×26): qty 30

## 2019-09-24 MED ORDER — FENTANYL CITRATE (PF) 100 MCG/2ML IJ SOLN
100.0000 ug | Freq: Once | INTRAMUSCULAR | Status: AC
  Administered 2019-09-24: 100 ug via INTRAVENOUS

## 2019-09-24 MED ORDER — DOXYCYCLINE HYCLATE 100 MG PO TABS: 100.0000 mg | ORAL_TABLET | Freq: Two times a day (BID) | ORAL | Status: DC

## 2019-09-24 MED ORDER — ROCURONIUM BROMIDE 50 MG/5ML IV SOLN
100.0000 mg | Freq: Once | INTRAVENOUS | Status: AC
  Administered 2019-09-24: 100 mg via INTRAVENOUS
  Filled 2019-09-24: qty 10

## 2019-09-24 MED ORDER — PANTOPRAZOLE SODIUM 40 MG PO PACK
40.0000 mg | PACK | Freq: Two times a day (BID) | ORAL | Status: DC
  Administered 2019-09-24 (×2): 40 mg
  Filled 2019-09-24 (×2): qty 20

## 2019-09-24 MED ORDER — PROPOFOL 1000 MG/100ML IV EMUL: 5.0000 ug/kg/min | INTRAVENOUS | Status: DC

## 2019-09-24 MED ORDER — FENTANYL CITRATE (PF) 100 MCG/2ML IJ SOLN: 100.0000 ug | Freq: Once | INTRAMUSCULAR | Status: AC

## 2019-09-24 MED ORDER — INSULIN ASPART 100 UNIT/ML ~~LOC~~ SOLN
0.0000 [IU] | Freq: Three times a day (TID) | SUBCUTANEOUS | Status: DC
  Administered 2019-09-24 – 2019-09-25 (×3): 2 [IU] via SUBCUTANEOUS

## 2019-09-24 MED ORDER — FENTANYL 2500MCG IN NS 250ML (10MCG/ML) PREMIX INFUSION
50.0000 ug/h | INTRAVENOUS | Status: DC
  Administered 2019-09-24: 50 ug/h via INTRAVENOUS
  Administered 2019-09-25: 100 ug/h via INTRAVENOUS
  Administered 2019-09-26: 175 ug/h via INTRAVENOUS
  Administered 2019-09-26: 100 ug/h via INTRAVENOUS
  Administered 2019-09-27: 125 ug/h via INTRAVENOUS
  Administered 2019-09-28 (×2): 300 ug/h via INTRAVENOUS
  Administered 2019-09-29: 200 ug/h via INTRAVENOUS
  Administered 2019-09-29: 250 ug/h via INTRAVENOUS
  Administered 2019-09-29: 300 ug/h via INTRAVENOUS
  Administered 2019-09-30: 250 ug/h via INTRAVENOUS
  Administered 2019-09-30: 150 ug/h via INTRAVENOUS
  Administered 2019-10-01: 50 ug/h via INTRAVENOUS
  Filled 2019-09-24 (×14): qty 250

## 2019-09-24 MED ORDER — INSULIN ASPART 100 UNIT/ML ~~LOC~~ SOLN: 0.0000 [IU] | Freq: Every day | SUBCUTANEOUS | Status: DC

## 2019-09-24 MED ORDER — ADULT MULTIVITAMIN W/MINERALS CH
1.0000 | ORAL_TABLET | Freq: Every day | ORAL | Status: DC
  Administered 2019-09-24 – 2019-10-09 (×15): 1
  Filled 2019-09-24 (×15): qty 1

## 2019-09-24 MED ORDER — FENTANYL CITRATE (PF) 100 MCG/2ML IJ SOLN
100.0000 ug | Freq: Once | INTRAMUSCULAR | Status: AC
  Filled 2019-09-24: qty 2

## 2019-09-24 MED ORDER — APIXABAN 5 MG PO TABS
5.0000 mg | ORAL_TABLET | Freq: Two times a day (BID) | ORAL | Status: DC
  Administered 2019-09-24 – 2019-09-25 (×3): 5 mg
  Filled 2019-09-24 (×3): qty 1

## 2019-09-24 MED ORDER — NOREPINEPHRINE 4 MG/250ML-% IV SOLN: 0.0000 ug/min | INTRAVENOUS | Status: DC

## 2019-09-24 MED ORDER — ROCURONIUM BROMIDE 50 MG/5ML IV SOLN
100.0000 mg | Freq: Once | INTRAVENOUS | Status: AC
  Filled 2019-09-24: qty 10

## 2019-09-24 NOTE — Progress Notes (Signed)
NAME:  Summer Guzman, MRN:  [id number], DOB:  Nov 05, 1965, LOS: 1 ADMISSION DATE:  09/17/2019, CONSULTATION DATE:  3/22 REFERRING MD: Butler Denmark, CHIEF COMPLAINT:  Dyspnea   Brief History   54 y/o female admitted to [facility] with ARDS due to COVID pneumonia on 3/21.  Intubated 3/22.  History of present illness   This is a pleasant 54 y/o female who tested positive for COVID on 3/17.  She believed she became infected after exposure to sick family.  She presented to [facility] on 3/21 with dyspnea, cough, found to be very hypoxemic.  Received Actemra, remdesivir, decadron.  Her respiratory rate continued to climb after admission and her O2 saturation dropped.  She complained of feeling more dyspneic.  After discussion with the patient it was decided to place her on mechanical ventilation. Developed atrial fib with RVR.   Past Medical History  asthma  Significant Hospital Events   3/21 admission 3/22 intubation, transfer [facility]  Consults:  PCCM  Procedures:  3/22 ETT >  3/22 L IJ CVL >   Significant Diagnostic Tests:  3/21 TTE> LVEF 70-75%, RV function normal, valves OK,   Micro Data:  3/17 SARS COV 2 > positive outside [place] 3/21 blood >  3/21 RVP >   Antimicrobials/COVID Rx:  3/21 cefepime x1 3/21 tocilizumab x1 3/21 remdesivir >  3/21 decadron >   Interim history/subjective:  Worsening dyspnea overnight  Objective   Blood pressure 127/74, pulse 81, temperature 98 F (36.7 C), temperature source Oral, resp. rate (!) 41, height 5\' 6"  (1.676 m), weight (!) 138.7 kg, last menstrual period 09/21/2019, SpO2 (!) 88 %.    Vent Mode: PRVC FiO2 (%):  [100 %] 100 % Set Rate:  [24 bmp] 24 bmp Vt Set:  [470 mL] 470 mL PEEP:  [10 cmH20] 10 cmH20 Plateau Pressure:  [26 cmH20] 26 cmH20   Intake/Output Summary (Last 24 hours) at 09/24/2019 1022 Last data filed at 09/24/2019 0400 Gross per 24 hour  Intake 2316.56 ml  Output --  Net 2316.56 ml   Filed Weights   10/03/2019 0855  09/30/2019 1600  Weight: (!) 137 kg (!) 138.7 kg    Examination: General:  Increased work of breathing in bed, can speak in full sentences, some accessory muscle use HENT: NCAT OP clear PULM: Crackles bases B, increased effort CV: tachy, no mgr GI: BS+, soft, nontender MSK: normal bulk and tone Neuro: awake, alert, no distress, MAEW   Resolved Hospital Problem list    Assessment & Plan:  ARDS due to COVID 19 pneumonia: Intubate now start mechanical ventilation per ARDS protocol Target TVol 6-8cc/kgIBW Target Plateau Pressure < 30cm H20 Target driving pressure less than 15 cm of water Target PaO2 55-65: titrate PEEP/FiO2 per protocol As long as PaO2 to FiO2 ratio is less than 1:150 position in prone position for 16 hours a day Check CVP daily if CVL in place Target CVP less than 4, diurese as necessary Ventilator associated pneumonia prevention protocol 3/22 decadron, remdesivir, received tocilizumab, monitor LFT, check CVP, check ABG post intubation, may need prone positioning, continue furosemide, stop vanc, stop cefepime, send resp culture, monitor.  Send to [facility] COVID ICU  Need for sedation for mechanical ventilation RASS target -2 Fentanyl infusion, propofol infusion and prn versed per PAD protocol  Nutrition: Start tube feeding per protocol Start free water via tube given high insensible losses on ARDS protocol  Atrial fib with RVR, new onset Tele Diltiazem infusion Eliquis  DM2 SSI  Best practice:  Diet: start tube feeding and  Pain/Anxiety/Delirium protocol (if indicated): Yes, RASS target -2, as above VAP protocol (if indicated): yes DVT prophylaxis: eliquis GI prophylaxis: PPI bid Glucose control: SSI Mobility: bed rest Code Status: full Family Communication: I updated her niece Zana on 3/22, she asks that the primary contact be changed to [employer] (sister) [phone] Disposition: move to [place] [facility]  Labs   CBC: Recent Labs  Lab  10/01/2019 0908 09/24/19 0322  WBC 5.9 2.2*  NEUTROABS 4.3 1.3*  HGB 14.5 13.3  HCT 46.5* 43.5  MCV 67.0* 67.3*  PLT 229 196    Basic Metabolic Panel: Recent Labs  Lab 09/28/2019 0908 09/24/2019 1922 09/24/19 0322  NA 139 138 139  K 3.1* 3.6 4.1  CL 102 105 109  CO2 21* 21* 20*  GLUCOSE 202* 138* 170*  BUN 17 15 17   CREATININE 0.88 0.65 0.62  CALCIUM 8.7* 8.2* 8.3*  MG  --  2.5* 2.6*  PHOS  --   --  2.4*   GFR: Estimated Creatinine Clearance: 118.3 mL/min (by C-G formula based on SCr of 0.62 mg/dL). Recent Labs  Lab 09/12/2019 0908 09/28/2019 1922 09/24/19 0322  PROCALCITON 5.00  --   --   WBC 5.9  --  2.2*  LATICACIDVEN 2.6* 1.4  --     Liver Function Tests: Recent Labs  Lab 09/11/2019 0908 09/24/19 0322  AST 77* 61*  ALT 46* 40  ALKPHOS 70 63  BILITOT 0.6 0.5  PROT 7.5 6.5  ALBUMIN 3.3* 2.7*   No results for input(s): LIPASE, AMYLASE in the last 168 hours. No results for input(s): AMMONIA in the last 168 hours.  ABG No results found for: PHART, PCO2ART, PO2ART, HCO3, TCO2, ACIDBASEDEF, O2SAT   Coagulation Profile: Recent Labs  Lab 09/29/2019 0908  INR 1.0    Cardiac Enzymes: No results for input(s): CKTOTAL, CKMB, CKMBINDEX, TROPONINI in the last 168 hours.  HbA1C: No results found for: HGBA1C  CBG: No results for input(s): GLUCAP in the last 168 hours.  Review of Systems:   Cannot obtain due to severity of respiratory failure.   Past Medical History  She,  has a past medical history of Asthma.   Surgical History    Past Surgical History:  Procedure Laterality Date  . CESAREAN SECTION       Social History   reports that she has never smoked. She has never used smokeless tobacco. She reports previous alcohol use. She reports previous drug use.   Family History   Her family history is not on file.   Allergies No Known Allergies   Home Medications  Prior to Admission medications   Medication Sig Start Date End Date Taking? Authorizing  Provider  albuterol (VENTOLIN HFA) 108 (90 Base) MCG/ACT inhaler Inhale 1-2 puffs into the lungs every 4 (four) hours as needed for wheezing or shortness of breath.   Yes [provider]  b complex vitamins capsule Take 1 capsule by mouth daily.   Yes [provider]  BIOTIN PO Take 1 tablet by mouth daily.   Yes [provider]  Cholecalciferol (VITAMIN D3 PO) Take 1 tablet by mouth daily.   Yes [provider]  Cyanocobalamin (VITAMIN B-12 PO) Take 1 tablet by mouth daily.   Yes [provider]  Multiple Vitamins-Minerals (IMMUNE SYSTEM BOOSTER PO) Take 1 capsule by mouth daily.   Yes [provider]  Omega-3 Fatty Acids (FISH OIL PO) Take 1 capsule by  mouth daily.   Yes [provider]  VITAMIN A PO Take 1 tablet by mouth daily.   Yes [provider]     Critical care time: 40 minutes     Heber [place], MD [place] [facility] Pager: [phone] Cell: [phone] If no response, call [phone]

## 2019-09-24 NOTE — Progress Notes (Signed)
PROGRESS NOTE    Summer Guzman    Code Status: Full Code  YSA:[id number] DOB: 08-28-1965 DOA: 10/01/2019 LOS: 1 days  PCP: Patient, No Pcp Per CC:  Chief Complaint  Patient presents with  . Shortness of Breath    COVID        Hospital Summary   This is a 54 year old female with a history of asthma, obesity, who recently moved from [place] who presented with generalized weakness and worsening SOB x 1 week with positive COVID contact. COVID positive one week ago. Found to be in new onset afib with RVR in ED started on cardizem drip and eliquis. Admitted to SDU with increasing O2 requirements and started on remdesivir/dexamethasone and Actemra. Worsening hypoxia. Started on empiric Vancomycin/Cefepime/doxycycline. PCCM consulted  A & P   Principal Problem:   Acute respiratory failure due to COVID-19 [facility]) Active Problems:   Pneumonia due to COVID-19 virus   New onset a-fib (HCC)   Obesity, Class III, BMI 40-49.9 (morbid obesity) (HCC)   1. Acute hypoxic respiratory failure secondary to COVID 19, in setting of Asthma history, probably with component of OHS, atelectasis and GERD  a. Worsening hypoxia requiring 50 L/min HFNC and NRB b. ED labs: CRP 11.3, Lactic acid 2.6, Procalcitonin 5.0, D dimer 1.08, Fibrinogen 602 c. Leukopenic today, afebrile d. Remdesivir x 5 days, Dexamethasone x 10 days, s/p Actemra x1 e. Start empiric Vanc/Cefepime/Doxycycline (holding Azithro with new onset afib with rvr) f. ABG g. Protonix BID h. Stop fluids, give Lasix x1 i. Standing inhalers j. Incentive spirometry and flutter valve k. Prone as tolerated l. CTA chest when able, currently on Eliquis for afib in case PE m. PCCM consulted as I am concerned she will likely need to be intubated shortly 2. New onset afib with rvr a. On cardizem drip b. eliquis started this admission c. Continue telemetry d. Follow up echo 3. Elevated LFTs likely viral induced, nearly resolved 4. Microcytosis a. [place]  index: 10. <13 suggests possible thalassemia b. Hb electrophoresis  5. Morbid Obesity a. Outpatient follow up  DVT prophylaxis: eliquis Family Communication: Patient's sister has been updated  Disposition Plan:   Patient came from:  home                                                                                          Anticipated d/c place: TBD  Barriers to d/c: medical stability  Pressure injury documentation    None  Consultants  PCCM  Procedures  none  Antibiotics   Anti-infectives (From admission, onward)   Start     Dose/Rate Route Frequency Ordered Stop   09/24/19 1000  remdesivir 100 mg in sodium chloride 0.9 % 100 mL IVPB  Status:  Discontinued     100 mg 200 mL/hr over 30 Minutes Intravenous Daily 10/02/2019 1046 09/15/2019 1051   09/24/19 1000  remdesivir 100 mg in sodium chloride 0.9 % 100 mL IVPB     100 mg 200 mL/hr over 30 Minutes Intravenous Daily 09/13/2019 1054 09/28/19 0959   09/24/19 0800  vancomycin (VANCOCIN) 2,500 mg in sodium chloride 0.9 % 500 mL IVPB  2,500 mg 250 mL/hr over 120 Minutes Intravenous  Once 09/24/19 0746     09/24/19 0800  ceFEPIme (MAXIPIME) 2 g in sodium chloride 0.9 % 100 mL IVPB     2 g 200 mL/hr over 30 Minutes Intravenous Every 8 hours 09/24/19 0746     09/30/2019 1130  remdesivir 100 mg in sodium chloride 0.9 % 100 mL IVPB     100 mg 200 mL/hr over 30 Minutes Intravenous  Once 09/04/2019 1054 09/15/2019 1307   09/29/2019 1100  remdesivir 200 mg in sodium chloride 0.9% 250 mL IVPB  Status:  Discontinued     200 mg 580 mL/hr over 30 Minutes Intravenous Once 10/03/2019 1046 09/11/2019 1051   09/27/2019 1100  remdesivir 100 mg in sodium chloride 0.9 % 100 mL IVPB     100 mg 200 mL/hr over 30 Minutes Intravenous  Once 09/22/2019 1054 10/01/2019 1151        Subjective   Examined at bedside, in respiratory distress. Currently denies any complaints but unable to provide much history due to conversational dyspnea and mask. Tight breath  sounds. Patient was given inhaler at bedside.  Objective   Vitals:   09/24/19 0315 09/24/19 0400 09/24/19 0600 09/24/19 0700  BP: 130/73 103/66 104/60 127/74  Pulse: 92 82 88 81  Resp: (!) 40 (!) 29 (!) 26 (!) 41  Temp:  98 F (36.7 C)    TempSrc:  Oral    SpO2: (!) 88% 95% (!) 88% (!) 88%  Weight:      Height:        Intake/Output Summary (Last 24 hours) at 09/24/2019 0849 Last data filed at 09/24/2019 0400 Gross per 24 hour  Intake 2316.56 ml  Output --  Net 2316.56 ml   Filed Weights   09/28/2019 0855 10/02/2019 1600  Weight: (!) 137 kg (!) 138.7 kg    Examination:  Physical Exam Vitals and nursing note reviewed.  Constitutional:      Appearance: She is toxic-appearing. She is not diaphoretic.  Cardiovascular:     Rate and Rhythm: Normal rate. Rhythm irregular.     Heart sounds: No murmur.  Pulmonary:     Effort: Tachypnea and accessory muscle usage present.     Comments: Tight breath sounds with some wheeze Abdominal:     Palpations: Abdomen is soft.     Tenderness: There is no guarding.  Musculoskeletal:     Right lower leg: No edema.     Left lower leg: No edema.  Neurological:     General: No focal deficit present.     Mental Status: She is alert.  Psychiatric:        Mood and Affect: Mood normal.     Data Reviewed: I have personally reviewed following labs and imaging studies  CBC: Recent Labs  Lab 09/12/2019 0908 09/24/19 0322  WBC 5.9 2.2*  NEUTROABS 4.3 1.3*  HGB 14.5 13.3  HCT 46.5* 43.5  MCV 67.0* 67.3*  PLT 229 236   Basic Metabolic Panel: Recent Labs  Lab 09/19/2019 0908 09/26/2019 1922 09/24/19 0322  NA 139 138 139  K 3.1* 3.6 4.1  CL 102 105 109  CO2 21* 21* 20*  GLUCOSE 202* 138* 170*  BUN 17 15 17   CREATININE 0.88 0.65 0.62  CALCIUM 8.7* 8.2* 8.3*  MG  --  2.5* 2.6*  PHOS  --   --  2.4*   GFR: Estimated Creatinine Clearance: 118.3 mL/min (by C-G formula based on SCr of 0.62 mg/dL).  Liver Function Tests: Recent Labs  Lab  2019/10/18 0908 09/24/19 0322  AST 77* 61*  ALT 46* 40  ALKPHOS 70 63  BILITOT 0.6 0.5  PROT 7.5 6.5  ALBUMIN 3.3* 2.7*   No results for input(s): LIPASE, AMYLASE in the last 168 hours. No results for input(s): AMMONIA in the last 168 hours. Coagulation Profile: Recent Labs  Lab 2019-10-18 0908  INR 1.0   Cardiac Enzymes: No results for input(s): CKTOTAL, CKMB, CKMBINDEX, TROPONINI in the last 168 hours. BNP (last 3 results) No results for input(s): PROBNP in the last 8760 hours. HbA1C: No results for input(s): HGBA1C in the last 72 hours. CBG: No results for input(s): GLUCAP in the last 168 hours. Lipid Profile: Recent Labs    Oct 18, 2019 0908  TRIG 137   Thyroid Function Tests: Recent Labs    2019-10-18 1045 10/18/2019 1049  TSH  --  4.108  FREET4 0.97  --    Anemia Panel: Recent Labs    2019-10-18 0908 10-18-19 1702 09/24/19 0322  VITAMINB12  --  5,996*  --   FOLATE  --  28.8  --   FERRITIN 282  --  216  TIBC  --  331  --   IRON  --  17*  --   RETICCTPCT  --  0.6  --    Sepsis Labs: Recent Labs  Lab Oct 18, 2019 0908 10/18/2019 1922  PROCALCITON 5.00  --   LATICACIDVEN 2.6* 1.4    No results found for this or any previous visit (from the past 240 hour(s)).       Radiology Studies: DG Chest Port 1 View  Result Date: 10-18-19 CLINICAL DATA:  Patient reporting from home with a diagnosis of COVID-19. EXAM: PORTABLE CHEST 1 VIEW COMPARISON:  None. FINDINGS: Bilateral pulmonary infiltrates are identified consistent with the reported history of COVID-19. Mild cardiomegaly. The hila and mediastinum are unremarkable given portable technique. No pneumothorax. No other acute abnormalities. IMPRESSION: Bilateral pulmonary infiltrates, particularly in the bases, consistent with the patient's COVID-19 status. Electronically Signed   By: Gerome Sam III M.D   On: 2019-10-18 09:44   ECHOCARDIOGRAM COMPLETE  Result Date: 18-Oct-2019    ECHOCARDIOGRAM REPORT   Patient  Name:   Summer Guzman Date of Exam: 2019-10-18 Medical Rec #:  [id number]    Height:       66.0 in Accession #:    [id number]   Weight:       302.0 lb Date of Birth:  12-Mar-1966   BSA:          2.383 m Patient Age:    53 years     BP:           112/61 mmHg Patient Gender: F            HR:           111 bpm. Exam Location:  Inpatient Procedure: 2D Echo, Color Doppler and Cardiac Doppler Indications:    R06.02 SOB  History:        Patient has no prior history of Echocardiogram examinations.                 Covid 19.  Sonographer:    Roosvelt Maser RDCS Referring Phys: [id number] SAIMA RIZWAN IMPRESSIONS  1. Left ventricular ejection fraction, by estimation, is 70 to 75%. The left ventricle has hyperdynamic function. The left ventricle has no regional wall motion abnormalities. There is mild left ventricular hypertrophy. Left ventricular diastolic parameters are indeterminate.  2. Right ventricular systolic function is normal. The right ventricular size is normal.  3. The mitral valve is normal in structure. No evidence of mitral valve regurgitation. No evidence of mitral stenosis.  4. The aortic valve is normal in structure. Aortic valve regurgitation is not visualized. No aortic stenosis is present.  5. The inferior vena cava is normal in size with greater than 50% respiratory variability, suggesting right atrial pressure of 3 mmHg. FINDINGS  Left Ventricle: Left ventricular ejection fraction, by estimation, is 70 to 75%. The left ventricle has hyperdynamic function. The left ventricle has no regional wall motion abnormalities. The left ventricular internal cavity size was normal in size. There is mild left ventricular hypertrophy. Left ventricular diastolic parameters are indeterminate. Right Ventricle: The right ventricular size is normal. No increase in right ventricular wall thickness. Right ventricular systolic function is normal. Left Atrium: Left atrial size was normal in size. Right Atrium: Right atrial size was normal  in size. Pericardium: There is no evidence of pericardial effusion. Mitral Valve: The mitral valve is normal in structure. Normal mobility of the mitral valve leaflets. No evidence of mitral valve regurgitation. No evidence of mitral valve stenosis. Tricuspid Valve: The tricuspid valve is normal in structure. Tricuspid valve regurgitation is not demonstrated. No evidence of tricuspid stenosis. Aortic Valve: The aortic valve is normal in structure. Aortic valve regurgitation is not visualized. No aortic stenosis is present. Pulmonic Valve: The pulmonic valve was normal in structure. Pulmonic valve regurgitation is not visualized. No evidence of pulmonic stenosis. Aorta: The aortic root is normal in size and structure. Venous: The inferior vena cava is normal in size with greater than 50% respiratory variability, suggesting right atrial pressure of 3 mmHg. IAS/Shunts: No atrial level shunt detected by color flow Doppler.  LEFT VENTRICLE PLAX 2D LVIDd:         3.80 cm LVIDs:         2.80 cm LV PW:         1.20 cm LV IVS:        1.20 cm LVOT diam:     2.10 cm LVOT Area:     3.46 cm  LV Volumes (MOD) LV vol d, MOD A4C: 57.1 ml LV vol s, MOD A4C: 20.7 ml LV SV MOD A4C:     57.1 ml RIGHT VENTRICLE RV Basal diam:  3.40 cm RV S prime:     16.10 cm/s TAPSE (M-mode): 2.4 cm LEFT ATRIUM             Index       RIGHT ATRIUM           Index LA diam:        3.40 cm 1.43 cm/m  RA Area:     16.00 cm LA Vol (A2C):   55.4 ml 23.25 ml/m RA Volume:   43.10 ml  18.09 ml/m LA Vol (A4C):   40.4 ml 16.96 ml/m LA Biplane Vol: 48.2 ml 20.23 ml/m   AORTA Ao Root diam: 2.80 cm Ao Asc diam:  3.10 cm  SHUNTS Systemic Diam: 2.10 cm Candee Furbish MD Electronically signed by Candee Furbish MD Signature Date/Time: 09/22/2019/3:17:22 PM    Final         Scheduled Meds: . apixaban  5 mg Oral BID  . vitamin C  500 mg Oral BID  . Chlorhexidine Gluconate Cloth  6 each Topical Q0600  . dexamethasone (DECADRON) injection  6 mg Intravenous Daily    . furosemide  40  mg Intravenous Once  . insulin aspart  0-5 Units Subcutaneous QHS  . insulin aspart  0-9 Units Subcutaneous TID WC  . Ipratropium-Albuterol  1 puff Inhalation QID  . multivitamin with minerals  1 tablet Oral Daily  . pantoprazole (PROTONIX) IV  40 mg Intravenous QHS  . zinc sulfate  220 mg Oral Daily   Continuous Infusions: . ceFEPime (MAXIPIME) IV 2 g (09/24/19 0820)  . diltiazem (CARDIZEM) infusion 10 mg/hr (Feb 01, 2020 1123)  . remdesivir 100 mg in NS 100 mL    . vancomycin       Time spent: 40 minutes with over 50% of the time coordinating the patient's care    Jae DireJared E Mykel Sponaugle, DO [facility] Pager [phone]  Call night coverage person covering after 7pm

## 2019-09-24 NOTE — Progress Notes (Signed)
RN entered room to check on patient for O2 of 85%-patient on 25L HHFNC FiO2 100% and 15L NRB. Patient started coughing spell and dropped O2 to 74% and unable to recover after consistent increases in HHFNC rate and use of inhaler at bedside. RT called and came to bedside with patient at [facility] w/ 15NRB- TR[facility] MD came to bedside to assess. CCM consulted and made decision to intubate and place central line. Will continue to monitor.   Sister Hector Shade) updated and selected as primary contact prior to intubation.

## 2019-09-24 NOTE — Progress Notes (Signed)
RT, Robin, hand delivered ABG to Lab (tube station malfunction).

## 2019-09-24 NOTE — Progress Notes (Signed)
Collected ordered sputum sample and gave to RN for requisition. Also, notified Lab that ABG being sent for analysis.

## 2019-09-24 NOTE — Progress Notes (Addendum)
Pharmacy Antibiotic Note  Summer Guzman is a 54 y.o. female admitted on 10-09-2019 with Covid PNA.  Pharmacy has been consulted for vancomycin & cefepime for HAP.  WBC 2.2( on decadron), SCr 0.62, CRP 10.2. AF/hypothermic   Plan: Remdesivir D2/5 Cefepime 2 gm IV q8h Vancomycin 2500 mg IV loading dose Vancomycin 1250 mg IV Q 12 hrs. Goal AUC 400-550. Expected AUC: 527.7   32.2/15.7 SCr used: 0.8 Vd 0.5 F/u renal function, WBC, temp, culture data Vancomycin levels as needed IV PPI> PO   Height: 5\' 6"  (167.6 cm) Weight: (!) 305 lb 12.5 oz (138.7 kg) IBW/kg (Calculated) : 59.3  Temp (24hrs), Avg:97.9 F (36.6 C), Min:97.4 F (36.3 C), Max:98.5 F (36.9 C)  Recent Labs  Lab 10/09/19 0908 10/09/2019 1922 09/24/19 0322  WBC 5.9  --  2.2*  CREATININE 0.88 0.65 0.62  LATICACIDVEN 2.6* 1.4  --     Estimated Creatinine Clearance: 118.3 mL/min (by C-G formula based on SCr of 0.62 mg/dL).    No Known Allergies Antimicrobials this admission:  3/21 remdesivir>> 3/25 3/21 actemra x1 3/22 Vanc>> 3/22 cefepime>> Dose adjustments this admission:   Microbiology results:  3/21 BCx2: sent 3/21 MRSA:ordered - not yet done 3/21 resp panel: ordered- not yet done 3/21 flu: ordered- not yet done 3/21 hep B NR 3/21 HIV NR  Thank you for allowing pharmacy to be a part of this patient's care.  4/21, Pharm.D [phone] 09/24/2019 8:39 AM

## 2019-09-24 NOTE — Progress Notes (Signed)
Initial Nutrition Assessment  DOCUMENTATION CODES:   Morbid obesity  INTERVENTION:  Vital High Protein @ 40 ml/hr with 30 ml prostat TID via OG tube  MVI with minerals daily  Tube feed regimen provides 1260 kcal (2028 kcal with propofol at current rate), 129 grams protein, and 806 ml H2O (2006 ml total H20 with free water flushes)  Continue to monitor magnesium, potassium, and phosphorus daily for at least 3 days, MD to replete as needed, as pt is at risk for refeeding, noted low Mg/P per labs.  NUTRITION DIAGNOSIS:   Inadequate oral intake related to inability to eat as evidenced by NPO status.    GOAL:   Provide needs based on ASPEN/SCCM guidelines    MONITOR:   Labs, I & O's, Vent status, TF tolerance, Weight trends, Diet advancement  REASON FOR ASSESSMENT:   Consult, Malnutrition Screening Tool, Ventilator Enteral/tube feeding initiation and management  ASSESSMENT:   54 year old female with past medical history of asthma who recently moved here from [place] presented to ED with worsening SOB over the past week and reports cough, nausea, vomiting, diarrhea, weakness and body aches and admitted on 3/21 for acute respiratory failure due to COVID-19 pneumonia.  3/17 SARS COV 2 positive outside [facility] 3/22 L IJ CVL 3/22 Intubated   Patient admitted to SDU on 3/21 with increasing O2 requirements and started on remdesivir/dexamethasone and Actemra with worsening hypoxia, after discussion with patient it was decided to place her on mechanical ventilation.  Per notes: -start free water given high insensible losses on ARDS protocol -new onset Atrial fib with RVR -ABG sent for analysis  BP: 106/78 (cuff)  MAP: 85 (cuff)  I/O: +2316 ml since admit  Current wt 138.7 kg (305.14 lbs) No weight history for review.  Patient is currently intubated and sedated on ventilator support MV: 12.2 L/min Temp (24hrs), Avg:98 F (36.7 C), Min:97.5 F (36.4 C), Max:98.5 F  (36.9 C)  Propofol: 29.1 ml/hr provides 768 kcal  Medications reviewed and include: Vit C, Decadron, SSI Drips: Cardizem 125 mg in D5 Fentanyl 50 mcg Remdesivir  Free water- 200 ml every 4 hours  Labs: CBG 173 P 2.4 (L), Mg 2.6 (L), K (WNL)  NUTRITION - FOCUSED PHYSICAL EXAM: Unable to complete at this time   Diet Order:   Diet Order            Diet NPO time specified  Diet effective now              EDUCATION NEEDS:   Not appropriate for education at this time  Skin:  Skin Assessment: Reviewed RN Assessment  Last BM:  3/21  Height:   Ht Readings from Last 1 Encounters:  09/05/2019 5\' 6"  (1.676 m)    Weight:   Wt Readings from Last 1 Encounters:  09/04/2019 (!) 138.7 kg    Ideal Body Weight:  59.1 kg  BMI:  Body mass index is 49.35 kg/m.  Estimated Nutritional Needs:   Kcal:  09/25/19  Protein:  120-148  Fluid:  >/= 2 L/day   [id number], RD, LDN Clinical Nutrition After Hours/Weekend Pager # in Amion

## 2019-09-24 NOTE — Progress Notes (Signed)
Per McQuaid MD O2 goal is >80%. Currently 90%- Will continue to monitor.

## 2019-09-24 NOTE — Procedures (Addendum)
Arterial Catheter Insertion Procedure Note Summer Guzman [id number] 1965-10-22  Procedure: Insertion of Arterial Catheter  Indications: Blood pressure monitoring and Frequent blood sampling  Procedure Details Consent: Risks of procedure as well as the alternatives and risks of each were explained to the (patient/caregiver).  Consent for procedure obtained. Time Out: Verified patient identification, verified procedure, site/side was marked, verified correct patient position, special equipment/implants available, medications/allergies/relevent history reviewed, required imaging and test results available.  Performed  Maximum sterile technique was used including antiseptics, cap, gloves, gown, hand hygiene, mask and sheet. Skin prep: Chlorhexidine; local anesthetic administered 20 gauge catheter was inserted into left radial artery using the Seldinger technique. ULTRASOUND GUIDANCE USED: NO Evaluation Blood flow good; BP tracing good. Complications: No apparent complications.   Mindi Curling 09/24/2019

## 2019-09-24 NOTE — Procedures (Signed)
Intubation Procedure Note Summer Guzman [id number] 1966-03-09  Procedure: Intubation Indications: Respiratory insufficiency  Procedure Details Consent: Risks of procedure as well as the alternatives and risks of each were explained to the (patient/caregiver).  Consent for procedure obtained. Time Out: Verified patient identification, verified procedure, site/side was marked, verified correct patient position, special equipment/implants available, medications/allergies/relevent history reviewed, required imaging and test results available.  Performed  Drugs versed 4mg , fentanl , etomidate 20mg , rocuronium 100mg  IV DL x 1 with GS 4 blade Grade 1 view 7.5 ET tube passed through cords under direct visualization Placement confirmed with bilateral breath sounds, positive EtCO2 change and smoke in tube   Evaluation Hemodynamic Status: BP stable throughout; O2 sats: stable throughout Patient's Current Condition: stable Complications: No apparent complications Patient did tolerate procedure well. Chest X-ray ordered to verify placement.  CXR: pending.   09/24/2019

## 2019-09-24 NOTE — Progress Notes (Signed)
Patient placed in prone position at this time with no complications. Patient's ETT secured with cloth tape in the proper position.

## 2019-09-24 NOTE — Procedures (Signed)
Central Venous Catheter Insertion Procedure Note Lenzi Marmo [id number] 12/01/1965  Procedure: Insertion of Central Venous Catheter Indications: Assessment of intravascular volume  Procedure Details Consent: Risks of procedure as well as the alternatives and risks of each were explained to the (patient/caregiver).  Consent for procedure obtained. Time Out: Verified patient identification, verified procedure, site/side was marked, verified correct patient position, special equipment/implants available, medications/allergies/relevent history reviewed, required imaging and test results available.  Performed  Maximum sterile technique was used including antiseptics, gloves, gown, hand hygiene, mask and sheet. Skin prep: Chlorhexidine; local anesthetic administered A antimicrobial bonded/coated triple lumen catheter was placed in the left internal jugular vein using the Seldinger technique. Left subclavian vein accessed but wire would not pass.  Ultrasound was used to verify the patency of the vein and for real time needle guidance.  Evaluation Blood flow good Complications: No apparent complications Patient did tolerate procedure well. Chest X-ray ordered to verify placement.  CXR: pending.  [place] 09/24/2019, 10:21 AM

## 2019-09-25 ENCOUNTER — Inpatient Hospital Stay (HOSPITAL_COMMUNITY): Payer: No Typology Code available for payment source

## 2019-09-25 DIAGNOSIS — J1282 Pneumonia due to coronavirus disease 2019: Secondary | ICD-10-CM

## 2019-09-25 DIAGNOSIS — I4891 Unspecified atrial fibrillation: Secondary | ICD-10-CM

## 2019-09-25 DIAGNOSIS — Z9911 Dependence on respirator [ventilator] status: Secondary | ICD-10-CM

## 2019-09-25 LAB — POCT I-STAT 7, (LYTES, BLD GAS, ICA,H+H)
Acid-base deficit: 2 mmol/L (ref 0.0–2.0)
Acid-base deficit: 2 mmol/L (ref 0.0–2.0)
Acid-base deficit: 2 mmol/L (ref 0.0–2.0)
Acid-base deficit: 3 mmol/L — ABNORMAL HIGH (ref 0.0–2.0)
Bicarbonate: 21.9 mmol/L (ref 20.0–28.0)
Bicarbonate: 23 mmol/L (ref 20.0–28.0)
Bicarbonate: 23.3 mmol/L (ref 20.0–28.0)
Bicarbonate: 25.2 mmol/L (ref 20.0–28.0)
Calcium, Ion: 1.23 mmol/L (ref 1.15–1.40)
Calcium, Ion: 1.24 mmol/L (ref 1.15–1.40)
Calcium, Ion: 1.25 mmol/L (ref 1.15–1.40)
Calcium, Ion: 1.26 mmol/L (ref 1.15–1.40)
HCT: 39 % (ref 36.0–46.0)
HCT: 39 % (ref 36.0–46.0)
HCT: 40 % (ref 36.0–46.0)
HCT: 40 % (ref 36.0–46.0)
Hemoglobin: 13.3 g/dL (ref 12.0–15.0)
Hemoglobin: 13.3 g/dL (ref 12.0–15.0)
Hemoglobin: 13.6 g/dL (ref 12.0–15.0)
Hemoglobin: 13.6 g/dL (ref 12.0–15.0)
O2 Saturation: 100 %
O2 Saturation: 96 %
O2 Saturation: 97 %
O2 Saturation: 97 %
Patient temperature: 97.7
Patient temperature: 97.8
Potassium: 3.8 mmol/L (ref 3.5–5.1)
Potassium: 3.9 mmol/L (ref 3.5–5.1)
Potassium: 3.9 mmol/L (ref 3.5–5.1)
Potassium: 4.1 mmol/L (ref 3.5–5.1)
Sodium: 137 mmol/L (ref 135–145)
Sodium: 138 mmol/L (ref 135–145)
Sodium: 138 mmol/L (ref 135–145)
Sodium: 139 mmol/L (ref 135–145)
TCO2: 23 mmol/L (ref 22–32)
TCO2: 24 mmol/L (ref 22–32)
TCO2: 25 mmol/L (ref 22–32)
TCO2: 27 mmol/L (ref 22–32)
pCO2 arterial: 37.2 mmHg (ref 32.0–48.0)
pCO2 arterial: 38.9 mmHg (ref 32.0–48.0)
pCO2 arterial: 41.1 mmHg (ref 32.0–48.0)
pCO2 arterial: 49 mmHg — ABNORMAL HIGH (ref 32.0–48.0)
pH, Arterial: 7.316 — ABNORMAL LOW (ref 7.350–7.450)
pH, Arterial: 7.36 (ref 7.350–7.450)
pH, Arterial: 7.378 (ref 7.350–7.450)
pH, Arterial: 7.38 (ref 7.350–7.450)
pO2, Arterial: 404 mmHg — ABNORMAL HIGH (ref 83.0–108.0)
pO2, Arterial: 85 mmHg (ref 83.0–108.0)
pO2, Arterial: 90 mmHg (ref 83.0–108.0)
pO2, Arterial: 91 mmHg (ref 83.0–108.0)

## 2019-09-25 LAB — COMPREHENSIVE METABOLIC PANEL
ALT: 34 U/L (ref 0–44)
AST: 41 U/L (ref 15–41)
Albumin: 2.7 g/dL — ABNORMAL LOW (ref 3.5–5.0)
Alkaline Phosphatase: 58 U/L (ref 38–126)
Anion gap: 12 (ref 5–15)
BUN: 18 mg/dL (ref 6–20)
CO2: 20 mmol/L — ABNORMAL LOW (ref 22–32)
Calcium: 8.8 mg/dL — ABNORMAL LOW (ref 8.9–10.3)
Chloride: 107 mmol/L (ref 98–111)
Creatinine, Ser: 0.8 mg/dL (ref 0.44–1.00)
GFR calc Af Amer: >60 mL/min (ref >60)
GFR calc non Af Amer: >60 mL/min (ref >60)
Glucose, Bld: 200 mg/dL — ABNORMAL HIGH (ref 70–99)
Potassium: 4.3 mmol/L (ref 3.5–5.1)
Sodium: 139 mmol/L (ref 135–145)
Total Bilirubin: 0.5 mg/dL (ref 0.3–1.2)
Total Protein: 6.6 g/dL (ref 6.5–8.1)

## 2019-09-25 LAB — CBC WITH DIFFERENTIAL/PLATELET
Abs Immature Granulocytes: 0 10*3/uL (ref 0.00–0.07)
Abs Immature Granulocytes: 0.04 10*3/uL (ref 0.00–0.07)
Basophils Absolute: 0 10*3/uL (ref 0.0–0.1)
Basophils Absolute: 0 10*3/uL (ref 0.0–0.1)
Basophils Relative: 0 %
Basophils Relative: 0 %
Eosinophils Absolute: 0 10*3/uL (ref 0.0–0.5)
Eosinophils Absolute: 0 10*3/uL (ref 0.0–0.5)
Eosinophils Relative: 0 %
Eosinophils Relative: 0 %
HCT: 41.1 % (ref 36.0–46.0)
HCT: 40.7 % (ref 36.0–46.0)
Hemoglobin: 12.5 g/dL (ref 12.0–15.0)
Hemoglobin: 12.3 g/dL (ref 12.0–15.0)
Immature Granulocytes: 1 %
Lymphocytes Relative: 10 %
Lymphocytes Relative: 21 %
Lymphs Abs: 0.4 10*3/uL — ABNORMAL LOW (ref 0.7–4.0)
Lymphs Abs: 0.8 10*3/uL (ref 0.7–4.0)
MCH: 20.8 pg — ABNORMAL LOW (ref 26.0–34.0)
MCH: 20.7 pg — ABNORMAL LOW (ref 26.0–34.0)
MCHC: 30.4 g/dL (ref 30.0–36.0)
MCHC: 30.2 g/dL (ref 30.0–36.0)
MCV: 68.5 fL — ABNORMAL LOW (ref 80.0–100.0)
MCV: 68.6 fL — ABNORMAL LOW (ref 80.0–100.0)
Monocytes Absolute: 0.6 10*3/uL (ref 0.1–1.0)
Monocytes Absolute: 0.5 10*3/uL (ref 0.1–1.0)
Monocytes Relative: 16 %
Monocytes Relative: 14 %
Neutro Abs: 2.6 10*3/uL (ref 1.7–7.7)
Neutro Abs: 2.4 10*3/uL (ref 1.7–7.7)
Neutrophils Relative %: 74 %
Neutrophils Relative %: 64 %
Platelets: 286 10*3/uL (ref 150–400)
Platelets: 290 10*3/uL (ref 150–400)
RBC: 6 MIL/uL — ABNORMAL HIGH (ref 3.87–5.11)
RBC: 5.93 MIL/uL — ABNORMAL HIGH (ref 3.87–5.11)
RDW: 18.5 % — ABNORMAL HIGH (ref 11.5–15.5)
RDW: 18.3 % — ABNORMAL HIGH (ref 11.5–15.5)
WBC: 3.5 10*3/uL — ABNORMAL LOW (ref 4.0–10.5)
WBC: 3.7 10*3/uL — ABNORMAL LOW (ref 4.0–10.5)
nRBC: 0 % (ref 0.0–0.2)
nRBC: 3 /100 WBC — ABNORMAL HIGH
nRBC: 0.5 % — ABNORMAL HIGH (ref 0.0–0.2)

## 2019-09-25 LAB — PHOSPHORUS: Phosphorus: 4.4 mg/dL (ref 2.5–4.6)

## 2019-09-25 LAB — D-DIMER, QUANTITATIVE: D-Dimer, Quant: 2.08 ug/mL-FEU — ABNORMAL HIGH (ref 0.00–0.50)

## 2019-09-25 LAB — GLUCOSE, CAPILLARY
Glucose-Capillary: 175 mg/dL — ABNORMAL HIGH (ref 70–99)
Glucose-Capillary: 186 mg/dL — ABNORMAL HIGH (ref 70–99)
Glucose-Capillary: 187 mg/dL — ABNORMAL HIGH (ref 70–99)
Glucose-Capillary: 187 mg/dL — ABNORMAL HIGH (ref 70–99)
Glucose-Capillary: 188 mg/dL — ABNORMAL HIGH (ref 70–99)
Glucose-Capillary: 217 mg/dL — ABNORMAL HIGH (ref 70–99)
Glucose-Capillary: 220 mg/dL — ABNORMAL HIGH (ref 70–99)

## 2019-09-25 LAB — FERRITIN: Ferritin: 201 ng/mL (ref 11–307)

## 2019-09-25 LAB — TRIGLYCERIDES: Triglycerides: 121 mg/dL (ref <150)

## 2019-09-25 LAB — MAGNESIUM: Magnesium: 2.6 mg/dL — ABNORMAL HIGH (ref 1.7–2.4)

## 2019-09-25 MED ORDER — ENOXAPARIN SODIUM 150 MG/ML ~~LOC~~ SOLN
1.0000 mg/kg | Freq: Two times a day (BID) | SUBCUTANEOUS | Status: DC
  Administered 2019-09-25 – 2019-09-26 (×2): 140 mg via SUBCUTANEOUS
  Filled 2019-09-25 (×2): qty 0.93

## 2019-09-25 MED ORDER — PANTOPRAZOLE SODIUM 40 MG PO PACK
40.0000 mg | PACK | Freq: Every day | ORAL | Status: DC
  Administered 2019-09-25 – 2019-10-03 (×9): 40 mg
  Filled 2019-09-25 (×11): qty 20

## 2019-09-25 MED ORDER — CISATRACURIUM BESYLATE 20 MG/10ML IV SOLN
10.0000 mg | Freq: Once | INTRAVENOUS | Status: DC
  Filled 2019-09-25: qty 10

## 2019-09-25 MED ORDER — INSULIN ASPART 100 UNIT/ML ~~LOC~~ SOLN
0.0000 [IU] | SUBCUTANEOUS | Status: DC
  Administered 2019-09-25: 5 [IU] via SUBCUTANEOUS
  Administered 2019-09-25 (×2): 3 [IU] via SUBCUTANEOUS
  Administered 2019-09-25 – 2019-09-26 (×2): 5 [IU] via SUBCUTANEOUS
  Administered 2019-09-26 – 2019-09-27 (×9): 3 [IU] via SUBCUTANEOUS
  Administered 2019-09-27 – 2019-09-28 (×3): 5 [IU] via SUBCUTANEOUS
  Administered 2019-09-28: 04:00:00 3 [IU] via SUBCUTANEOUS
  Administered 2019-09-28 – 2019-09-29 (×4): 5 [IU] via SUBCUTANEOUS
  Administered 2019-09-29 (×4): 3 [IU] via SUBCUTANEOUS
  Administered 2019-09-29 – 2019-09-30 (×3): 5 [IU] via SUBCUTANEOUS
  Administered 2019-09-30: 2 [IU] via SUBCUTANEOUS
  Administered 2019-09-30: 5 [IU] via SUBCUTANEOUS
  Administered 2019-09-30: 04:00:00 3 [IU] via SUBCUTANEOUS
  Administered 2019-09-30: 2 [IU] via SUBCUTANEOUS
  Administered 2019-09-30: 3 [IU] via SUBCUTANEOUS
  Administered 2019-10-01 (×2): 2 [IU] via SUBCUTANEOUS
  Administered 2019-10-01: 15:00:00 3 [IU] via SUBCUTANEOUS
  Administered 2019-10-01 – 2019-10-02 (×3): 2 [IU] via SUBCUTANEOUS
  Administered 2019-10-02: 3 [IU] via SUBCUTANEOUS
  Administered 2019-10-03 – 2019-10-06 (×6): 2 [IU] via SUBCUTANEOUS
  Administered 2019-10-06: 8 [IU] via SUBCUTANEOUS
  Administered 2019-10-06: 3 [IU] via SUBCUTANEOUS
  Administered 2019-10-07: 2 [IU] via SUBCUTANEOUS
  Administered 2019-10-07: 3 [IU] via SUBCUTANEOUS
  Administered 2019-10-07 – 2019-10-09 (×6): 2 [IU] via SUBCUTANEOUS
  Administered 2019-10-10: 5 [IU] via SUBCUTANEOUS

## 2019-09-25 NOTE — Progress Notes (Signed)
eLink Physician-Brief Progress Note Patient Name: Summer Guzman DOB: [birth year] MRN: [id number]   Date of Service  09/25/2019  HPI/Events of Note  ARDS Pt s/p proning with transient desaturation, now close to baseline.  eICU Interventions  New Patient Evaluation  Completed.     Intervention Category Intermediate Interventions: Respiratory distress - evaluation and management  Migdalia Dk 09/25/2019, 2:59 AM

## 2019-09-25 NOTE — Progress Notes (Signed)
Patient's head turned and arms repositioned without any complications.  

## 2019-09-25 NOTE — Progress Notes (Signed)
Patients phone was given to sister but all other belongings including wallet remain with patient until she is alert and oriented to make decisions.

## 2019-09-25 NOTE — Progress Notes (Signed)
Pt turned from supine to prone position with no complications.

## 2019-09-25 NOTE — Progress Notes (Signed)
Update given to patients sister over the phone.

## 2019-09-25 NOTE — Progress Notes (Signed)
Pt supined without any complications. Commercial ETT holder placed.

## 2019-09-25 NOTE — Progress Notes (Signed)
NAME:  Summer Guzman, MRN:  [id number], DOB:  11/25/1965, LOS: 2 ADMISSION DATE:  10-07-19, CONSULTATION DATE:  3/22 REFERRING MD: Butler Denmark, CHIEF COMPLAINT:  Dyspnea   Brief History   54 y/o female admitted to [facility] with ARDS due to COVID pneumonia on 3/21.  Intubated 3/22.  History of present illness   This is a pleasant 54 y/o female who tested positive for COVID on 3/17.  She believed she became infected after exposure to sick family.  She presented to [facility] on 3/21 with dyspnea, cough, found to be very hypoxemic.  Received Actemra, remdesivir, decadron.  Her respiratory rate continued to climb after admission and her O2 saturation dropped.  She complained of feeling more dyspneic.  After discussion with the patient it was decided to place her on mechanical ventilation. Developed atrial fib with RVR.   Past Medical History  asthma  Significant Hospital Events   3/21 admission 3/22 intubation, transfer from [facility] to [facility]  Consults:  PCCM  Procedures:  3/22 ETT >  3/22 L IJ CVL >  3/22 radial A-line>  Significant Diagnostic Tests:  3/21 TTE> LVEF 70-75%, RV function normal, valves OK,   Micro Data:  3/17 SARS COV 2 > positive outside [place] 3/21 blood >   Antimicrobials/COVID Rx:  3/21 cefepime x1 3/21 tocilizumab x1 3/21 remdesivir >  3/21 decadron >   Interim history/subjective:  Transferred from [facility] overnight. Proned successfully ~11pm.  Objective   Blood pressure (!) 123/91, pulse 94, temperature 98.2 F (36.8 C), temperature source Axillary, resp. rate (!) 24, height 5\' 6"  (1.676 m), weight (!) 138.7 kg, last menstrual period 09/21/2019, SpO2 97 %.    Vent Mode: PRVC FiO2 (%):  [80 %-100 %] 80 % Set Rate:  [24 bmp] 24 bmp Vt Set:  [470 mL-4170 mL] 470 mL PEEP:  [10 cmH20-16 cmH20] 14 cmH20 Plateau Pressure:  [21 cmH20-28 cmH20] 23 cmH20   Intake/Output Summary (Last 24 hours) at 09/25/2019 0834 Last data filed at 09/25/2019 0816 Gross per 24 hour  Intake  3496.02 ml  Output 650 ml  Net 2846.02 ml   Filed Weights   10/07/2019 0855 2019-10-07 1600  Weight: (!) 137 kg (!) 138.7 kg    Examination: General: Examined in prone position.  Critically ill-appearing woman on mechanical ventilation. HENT: [place]/AT, ETT in place PULM: [place] bilaterally, symmetric air entry.  Plateau 24, driving pressure 10.  Synchronous with the vent. CV: Regular rate, irreg rhythm, rate-controlled afib on telemetry GI: Obese, soft MSK: Normal muscle mass for age, no peripheral edema Neuro: RASS -5 on fentanyl and propofol   Resolved Hospital Problem list    Assessment & Plan:  Severe ARDS due to COVID 19 pneumonia: -Continue low tidal volume ventilation, 4 to 8 cc/kg with goal plateau less than 30 and driving pressure less than 15.  Permissive hypercapnia okay.  Currently meeting vent goals.  -Titrate PEEP and FiO2 per ARDS ladder -Prone until P:F remains>150  -NMB as needed quired to maintain vent synchrony for lung protective ventilation.  Requires RASS -5. -Conservative fluid strategy -Previously received Tocilizumab -Continue remdesivir and Decadron per protocol  Need for sedation for mechanical ventilation -Target RASS -4 and vent synchrony. -Continue fentanyl and propofol, PSD protocol  Nutrition: -Tube feeding and free water flushes   Atrial fib with RVR, new onset. Back in NSR. -Continue telemetry monitoring -Continue diltiazem -Lovenox twice daily  DM2 -Accu-Cheks Q4h with sliding scale insulin as needed -Goal BG 140-180 while admitted to the  ICU   Best practice:  Diet: tube feeding  Pain/Anxiety/Delirium protocol (if indicated): ordered  VAP protocol (if indicated): yes DVT prophylaxis: lovenox GI prophylaxis: PPI   Glucose control: SSI Mobility: bed rest Code Status: full Family Communication: L/M for Weyman Rodney, sister Disposition: ICU  Labs   CBC: Recent Labs  Lab 10-19-19 0908 19-Oct-2019 0908 09/24/19 0322 09/25/19 0006  09/25/19 0409 09/25/19 0419 09/25/19 0428  WBC 5.9  --  2.2*  --   --  3.5* 3.7*  NEUTROABS 4.3  --  1.3*  --   --  2.6 2.4  HGB 14.5   < > 13.3 13.6 13.6 12.5 12.3  HCT 46.5*   < > 43.5 40.0 40.0 41.1 40.7  MCV 67.0*  --  67.3*  --   --  68.5* 68.6*  PLT 229  --  236  --   --  286 290   < > = values in this interval not displayed.    Basic Metabolic Panel: Recent Labs  Lab Oct 19, 2019 0908 2019/10/19 0908 Oct 19, 2019 1922 09/24/19 0322 09/24/19 1700 09/25/19 0006 09/25/19 0409 09/25/19 0428  NA 139   < > 138 139  --  139 138 139  K 3.1*   < > 3.6 4.1  --  3.9 4.1 4.3  CL 102  --  105 109  --   --   --  107  CO2 21*  --  21* 20*  --   --   --  20*  GLUCOSE 202*  --  138* 170*  --   --   --  200*  BUN 17  --  15 17  --   --   --  18  CREATININE 0.88  --  0.65 0.62  --   --   --  0.80  CALCIUM 8.7*  --  8.2* 8.3*  --   --   --  8.8*  MG  --   --  2.5* 2.6* 2.6*  --   --  2.6*  PHOS  --   --   --  2.4* 3.1  --   --  4.4   < > = values in this interval not displayed.   GFR: Estimated Creatinine Clearance: 118.3 mL/min (by C-G formula based on SCr of 0.8 mg/dL). Recent Labs  Lab 2019/10/19 0908 2019/10/19 1922 09/24/19 0322 09/25/19 0419 09/25/19 0428  PROCALCITON 5.00  --   --   --   --   WBC 5.9  --  2.2* 3.5* 3.7*  LATICACIDVEN 2.6* 1.4  --   --   --     Liver Function Tests: Recent Labs  Lab 2019/10/19 0908 09/24/19 0322 09/25/19 0428  AST 77* 61* 41  ALT 46* 40 34  ALKPHOS 70 63 58  BILITOT 0.6 0.5 0.5  PROT 7.5 6.5 6.6  ALBUMIN 3.3* 2.7* 2.7*   No results for input(s): LIPASE, AMYLASE in the last 168 hours. No results for input(s): AMMONIA in the last 168 hours.  ABG    Component Value Date/Time   PHART 7.360 09/25/2019 0409   PCO2ART 41.1 09/25/2019 0409   PO2ART 91.0 09/25/2019 0409   HCO3 23.3 09/25/2019 0409   TCO2 25 09/25/2019 0409   ACIDBASEDEF 2.0 09/25/2019 0409   O2SAT 97.0 09/25/2019 0409     Coagulation Profile: Recent Labs  Lab October 19, 2019  0908  INR 1.0    Cardiac Enzymes: No results for input(s): CKTOTAL, CKMB, CKMBINDEX, TROPONINI in the last 168 hours.  HbA1C: Hgb A1c MFr Bld  Date/Time Value Ref Range Status  Sep 27, 2019 05:02 PM 6.4 (H) 4.8 - 5.6 % Final    Comment:    (NOTE) Pre diabetes:          5.7%-6.4% Diabetes:              >6.4% Glycemic control for   <7.0% adults with diabetes     CBG: Recent Labs  Lab 09/24/19 1938 09/24/19 2213 09/25/19 0131 09/25/19 0407 09/25/19 0744  GLUCAP 214* 185* 186* 187* 188*    This patient is critically ill with multiple organ system failure which requires frequent high complexity decision making, assessment, support, evaluation, and titration of therapies. This was completed through the application of advanced monitoring technologies and extensive interpretation of multiple databases. During this encounter critical care time was devoted to patient care services described in this note for 40 minutes.  Julian Hy, DO 09/25/19 8:34 AM [place] [facility]

## 2019-09-25 NOTE — Care Plan (Signed)
Updated sister Suzette by phone.  Steffanie Dunn, DO 09/25/19 11:19 AM [place] [facility]

## 2019-09-25 NOTE — Progress Notes (Signed)
Pt sister never came and got pt's phone. Pt phone is now back on unit at patient's bedside. Sister called and explained that all she needs the phone for is to get the patient's landlord phone number so she can pay pt's rent. Charge RN is going to charge pt's cell phone and try to retrieve landlord's phone number for sister.

## 2019-09-26 DIAGNOSIS — Z7901 Long term (current) use of anticoagulants: Secondary | ICD-10-CM

## 2019-09-26 DIAGNOSIS — J80 Acute respiratory distress syndrome: Secondary | ICD-10-CM

## 2019-09-26 DIAGNOSIS — R739 Hyperglycemia, unspecified: Secondary | ICD-10-CM

## 2019-09-26 LAB — COMPREHENSIVE METABOLIC PANEL
ALT: 31 U/L (ref 0–44)
AST: 32 U/L (ref 15–41)
Albumin: 2.5 g/dL — ABNORMAL LOW (ref 3.5–5.0)
Alkaline Phosphatase: 50 U/L (ref 38–126)
Anion gap: 11 (ref 5–15)
BUN: 18 mg/dL (ref 6–20)
CO2: 22 mmol/L (ref 22–32)
Calcium: 8.7 mg/dL — ABNORMAL LOW (ref 8.9–10.3)
Chloride: 104 mmol/L (ref 98–111)
Creatinine, Ser: 0.62 mg/dL (ref 0.44–1.00)
GFR calc Af Amer: >60 mL/min (ref >60)
GFR calc non Af Amer: >60 mL/min (ref >60)
Glucose, Bld: 201 mg/dL — ABNORMAL HIGH (ref 70–99)
Potassium: 4.2 mmol/L (ref 3.5–5.1)
Sodium: 137 mmol/L (ref 135–145)
Total Bilirubin: 0.5 mg/dL (ref 0.3–1.2)
Total Protein: 5.8 g/dL — ABNORMAL LOW (ref 6.5–8.1)

## 2019-09-26 LAB — CBC WITH DIFFERENTIAL/PLATELET
Abs Immature Granulocytes: 0.07 10*3/uL (ref 0.00–0.07)
Basophils Absolute: 0 10*3/uL (ref 0.0–0.1)
Basophils Relative: 0 %
Eosinophils Absolute: 0 10*3/uL (ref 0.0–0.5)
Eosinophils Relative: 0 %
HCT: 38.4 % (ref 36.0–46.0)
Hemoglobin: 11.7 g/dL — ABNORMAL LOW (ref 12.0–15.0)
Immature Granulocytes: 2 %
Lymphocytes Relative: 16 %
Lymphs Abs: 0.7 10*3/uL (ref 0.7–4.0)
MCH: 21.1 pg — ABNORMAL LOW (ref 26.0–34.0)
MCHC: 30.5 g/dL (ref 30.0–36.0)
MCV: 69.2 fL — ABNORMAL LOW (ref 80.0–100.0)
Monocytes Absolute: 0.5 10*3/uL (ref 0.1–1.0)
Monocytes Relative: 10 %
Neutro Abs: 3.2 10*3/uL (ref 1.7–7.7)
Neutrophils Relative %: 72 %
Platelets: 299 10*3/uL (ref 150–400)
RBC: 5.55 MIL/uL — ABNORMAL HIGH (ref 3.87–5.11)
RDW: 18.4 % — ABNORMAL HIGH (ref 11.5–15.5)
WBC: 4.4 10*3/uL (ref 4.0–10.5)
nRBC: 0.9 % — ABNORMAL HIGH (ref 0.0–0.2)

## 2019-09-26 LAB — HGB FRACTIONATION CASCADE
Hgb A2: 2.3 % (ref 1.8–3.2)
Hgb A: 97.7 % (ref 96.4–98.8)
Hgb F: 0 % (ref 0.0–2.0)
Hgb S: 0 %

## 2019-09-26 LAB — FERRITIN: Ferritin: 123 ng/mL (ref 11–307)

## 2019-09-26 LAB — TRIGLYCERIDES: Triglycerides: 159 mg/dL — ABNORMAL HIGH (ref <150)

## 2019-09-26 LAB — POCT I-STAT 7, (LYTES, BLD GAS, ICA,H+H)
Acid-base deficit: 1 mmol/L (ref 0.0–2.0)
Bicarbonate: 24.7 mmol/L (ref 20.0–28.0)
Calcium, Ion: 1.23 mmol/L (ref 1.15–1.40)
HCT: 37 % (ref 36.0–46.0)
Hemoglobin: 12.6 g/dL (ref 12.0–15.0)
O2 Saturation: 99 %
Potassium: 3.8 mmol/L (ref 3.5–5.1)
Sodium: 139 mmol/L (ref 135–145)
TCO2: 26 mmol/L (ref 22–32)
pCO2 arterial: 42.7 mmHg (ref 32.0–48.0)
pH, Arterial: 7.37 (ref 7.350–7.450)
pO2, Arterial: 158 mmHg — ABNORMAL HIGH (ref 83.0–108.0)

## 2019-09-26 LAB — CULTURE, RESPIRATORY W GRAM STAIN
Culture: NO GROWTH
Gram Stain: NONE SEEN
Special Requests: NORMAL

## 2019-09-26 LAB — GLUCOSE, CAPILLARY
Glucose-Capillary: 188 mg/dL — ABNORMAL HIGH (ref 70–99)
Glucose-Capillary: 183 mg/dL — ABNORMAL HIGH (ref 70–99)
Glucose-Capillary: 204 mg/dL — ABNORMAL HIGH (ref 70–99)
Glucose-Capillary: 197 mg/dL — ABNORMAL HIGH (ref 70–99)
Glucose-Capillary: 169 mg/dL — ABNORMAL HIGH (ref 70–99)
Glucose-Capillary: 187 mg/dL — ABNORMAL HIGH (ref 70–99)

## 2019-09-26 LAB — C-REACTIVE PROTEIN: CRP: 3.4 mg/dL — ABNORMAL HIGH (ref <1.0)

## 2019-09-26 LAB — D-DIMER, QUANTITATIVE: D-Dimer, Quant: 0.74 ug/mL-FEU — ABNORMAL HIGH (ref 0.00–0.50)

## 2019-09-26 LAB — PHOSPHORUS: Phosphorus: 4.3 mg/dL (ref 2.5–4.6)

## 2019-09-26 LAB — MAGNESIUM: Magnesium: 2.6 mg/dL — ABNORMAL HIGH (ref 1.7–2.4)

## 2019-09-26 MED ORDER — ENOXAPARIN SODIUM 150 MG/ML ~~LOC~~ SOLN
1.0000 mg/kg | Freq: Two times a day (BID) | SUBCUTANEOUS | Status: DC
  Administered 2019-09-26 – 2019-10-02 (×12): 150 mg via SUBCUTANEOUS
  Filled 2019-09-26 (×13): qty 1

## 2019-09-26 MED ORDER — INSULIN GLARGINE 100 UNIT/ML ~~LOC~~ SOLN
10.0000 [IU] | Freq: Every day | SUBCUTANEOUS | Status: DC
  Administered 2019-09-26: 10 [IU] via SUBCUTANEOUS
  Filled 2019-09-26 (×2): qty 0.1

## 2019-09-26 MED ORDER — FUROSEMIDE 10 MG/ML IJ SOLN
40.0000 mg | Freq: Once | INTRAMUSCULAR | Status: AC
  Administered 2019-09-26: 40 mg via INTRAVENOUS
  Filled 2019-09-26: qty 4

## 2019-09-26 MED ORDER — DILTIAZEM 12 MG/ML ORAL SUSPENSION
25.0000 mg | Freq: Four times a day (QID) | ORAL | Status: DC
  Administered 2019-09-26 – 2019-09-27 (×5): 25 mg
  Filled 2019-09-26 (×7): qty 3

## 2019-09-26 NOTE — Progress Notes (Signed)
NAME:  Daylee Delahoz, MRN:  [id number], DOB:  Nov 14, 1965, LOS: 3 ADMISSION DATE:  10-02-19, CONSULTATION DATE:  3/22 REFERRING MD: Wynelle Cleveland, CHIEF COMPLAINT:  Dyspnea   Brief History   54 y/o female admitted to [facility] with ARDS due to COVID pneumonia on 3/21.  Intubated 3/22.  History of present illness   This is a pleasant 54 y/o female who tested positive for COVID on 3/17.  She believed she became infected after exposure to sick family.  She presented to [facility] on 3/21 with dyspnea, cough, found to be very hypoxemic.  Received Actemra, remdesivir, decadron.  Her respiratory rate continued to climb after admission and her O2 saturation dropped.  She complained of feeling more dyspneic.  After discussion with the patient it was decided to place her on mechanical ventilation. Developed atrial fib with RVR.   Past Medical History  asthma  Significant Hospital Events   3/21 admission 3/22 intubation, transfer from [facility] to [facility]  Consults:  PCCM  Procedures:  3/22 ETT >  3/22 L IJ CVL >  3/22 radial A-line>  Significant Diagnostic Tests:  3/21 TTE> LVEF 70-75%, RV function normal, valves OK,   Micro Data:  3/17 SARS COV 2 > positive outside [place] 3/21 blood >  3/22 sputum>   Antimicrobials/COVID Rx:  3/21 cefepime x1 3/21 tocilizumab x1 3/21 remdesivir >  3/21 decadron >   Interim history/subjective:  Proned again yesterday afternoon.  Objective   Blood pressure 103/76, pulse 66, temperature (!) 94.2 F (34.6 C), temperature source Oral, resp. rate (!) 24, height 5\' 6"  (1.676 m), weight (!) 150.8 kg, last menstrual period 09/21/2019, SpO2 97 %.    Vent Mode: PRVC FiO2 (%):  [50 %-80 %] 50 % Set Rate:  [24 bmp] 24 bmp Vt Set:  [470 mL] 470 mL PEEP:  [14 cmH20] 14 cmH20 Plateau Pressure:  [25 cmH20-28 cmH20] 27 cmH20   Intake/Output Summary (Last 24 hours) at 09/26/2019 0913 Last data filed at 09/26/2019 0800 Gross per 24 hour  Intake 2605.79 ml  Output 1170 ml  Net  1435.79 ml   Filed Weights   02-Oct-2019 0855 2019/10/02 1600 09/26/19 0500  Weight: (!) 137 kg (!) 138.7 kg (!) 150.8 kg    Examination:  General: critically ill-appearing woman intubated, sedated, proned HENT: [place]/ AT, eyes anicteric PULM: CTAB, breathing synchronously with the vent. P plat 27, driving pressure 13 CV: Regular rate, regular rhythm.  Rate controlled atrial fibrillation on telemetry. GI: Obese, soft Extremities: Normal muscle mass for age, no peripheral edema.  Left radial A-line with good distal perfusion. Neuro: RASS -5 on propofol, fentanyl   Resolved Hospital Problem list    Assessment & Plan:  Severe ARDS due to COVID 19 pneumonia: -Continue low tidal volume ventilation, 4 to 8 cc/kg ideal body weight with goal plateau less than 30 driving pressure less than 15.  Permissive hypercapnia.  Titrate PEEP and FiO2 per ARDS ladder.  Currently meeting goals. -Continue proning 16 hours/day if P:F remains greater than 150 -As needed NMB for vent synchrony -Conservative fluid strategy.  Lasix x1 today for goal net even or net negative fluid balance -VAP prevention protocol -Previously received tocilizumab -Continue remdesivir and Decadron per protocol  Need for sedation for mechanical ventilation -Target RASS -4 and vent synchrony. -Continue fentanyl and propofol, PAD protocol  Nutrition: Tube feeds and free water flushes  Atrial fib with RVR, new onset. Back in NSR. -Continue telemetry monitoring -Transition to diltiazem enterally, 25mg  Q6h -Continue  Lovenox twice daily  DM2 -Adding Lantus 10 units once daily -Accu-Cheks Q4h with sliding scale insulin as needed -Goal BG 140-180 while admitted to the ICU   Best practice:  Diet: tube feeding  Pain/Anxiety/Delirium protocol (if indicated): ordered  VAP protocol (if indicated): yes DVT prophylaxis: lovenox GI prophylaxis: PPI   Glucose control: SSI Mobility: bed rest Code Status: full Family Communication:  Suzette updated Disposition: ICU  Labs   CBC: Recent Labs  Lab 09/25/2019 0908 09/09/2019 0908 09/24/19 0322 09/25/19 0006 09/25/19 0419 09/25/19 0428 09/25/19 1458 09/25/19 1748 09/26/19 0451  WBC 5.9  --  2.2*  --  3.5* 3.7*  --   --  4.4  NEUTROABS 4.3  --  1.3*  --  2.6 2.4  --   --  3.2  HGB 14.5   < > 13.3   < > 12.5 12.3 13.3 13.3 11.7*  HCT 46.5*   < > 43.5   < > 41.1 40.7 39.0 39.0 38.4  MCV 67.0*  --  67.3*  --  68.5* 68.6*  --   --  69.2*  PLT 229  --  236  --  286 290  --   --  299   < > = values in this interval not displayed.    Basic Metabolic Panel: Recent Labs  Lab 09/17/2019 0908 09/04/2019 0908 09/08/2019 1922 09/22/2019 1922 09/24/19 0322 09/24/19 1700 09/25/19 0006 09/25/19 0409 09/25/19 0428 09/25/19 1458 09/25/19 1748 09/26/19 0451  NA 139   < > 138   < > 139  --    < > 138 139 137 138 137  K 3.1*   < > 3.6   < > 4.1  --    < > 4.1 4.3 3.9 3.8 4.2  CL 102  --  105  --  109  --   --   --  107  --   --  104  CO2 21*  --  21*  --  20*  --   --   --  20*  --   --  22  GLUCOSE 202*  --  138*  --  170*  --   --   --  200*  --   --  201*  BUN 17  --  15  --  17  --   --   --  18  --   --  18  CREATININE 0.88  --  0.65  --  0.62  --   --   --  0.80  --   --  0.62  CALCIUM 8.7*  --  8.2*  --  8.3*  --   --   --  8.8*  --   --  8.7*  MG  --   --  2.5*  --  2.6* 2.6*  --   --  2.6*  --   --  2.6*  PHOS  --   --   --   --  2.4* 3.1  --   --  4.4  --   --  4.3   < > = values in this interval not displayed.   GFR: Estimated Creatinine Clearance: 124.5 mL/min (by C-G formula based on SCr of 0.62 mg/dL). Recent Labs  Lab 09/29/2019 0908 09/03/2019 0908 09/26/2019 1922 09/24/19 0322 09/25/19 0419 09/25/19 0428 09/26/19 0451  PROCALCITON 5.00  --   --   --   --   --   --   WBC 5.9   < >  --  2.2* 3.5* 3.7* 4.4  LATICACIDVEN 2.6*  --  1.4  --   --   --   --    < > = values in this interval not displayed.    Liver Function Tests: Recent Labs  Lab 10-01-19 0908  09/24/19 0322 09/25/19 0428 09/26/19 0451  AST 77* 61* 41 32  ALT 46* 40 34 31  ALKPHOS 70 63 58 50  BILITOT 0.6 0.5 0.5 0.5  PROT 7.5 6.5 6.6 5.8*  ALBUMIN 3.3* 2.7* 2.7* 2.5*   No results for input(s): LIPASE, AMYLASE in the last 168 hours. No results for input(s): AMMONIA in the last 168 hours.  ABG    Component Value Date/Time   PHART 7.378 09/25/2019 1748   PCO2ART 37.2 09/25/2019 1748   PO2ART 85.0 09/25/2019 1748   HCO3 21.9 09/25/2019 1748   TCO2 23 09/25/2019 1748   ACIDBASEDEF 3.0 (H) 09/25/2019 1748   O2SAT 96.0 09/25/2019 1748     Coagulation Profile: Recent Labs  Lab 10/01/2019 0908  INR 1.0    Cardiac Enzymes: No results for input(s): CKTOTAL, CKMB, CKMBINDEX, TROPONINI in the last 168 hours.  HbA1C: Hgb A1c MFr Bld  Date/Time Value Ref Range Status  10-01-19 05:02 PM 6.4 (H) 4.8 - 5.6 % Final    Comment:    (NOTE) Pre diabetes:          5.7%-6.4% Diabetes:              >6.4% Glycemic control for   <7.0% adults with diabetes     CBG: Recent Labs  Lab 09/25/19 1647 09/25/19 2010 09/25/19 2349 09/26/19 0454 09/26/19 0741  GLUCAP 187* 217* 220* 188* 183*    This patient is critically ill with multiple organ system failure which requires frequent high complexity decision making, assessment, support, evaluation, and titration of therapies. This was completed through the application of advanced monitoring technologies and extensive interpretation of multiple databases. During this encounter critical care time was devoted to patient care services described in this note for 35 minutes.   Steffanie Dunn, DO 09/26/19 10:41 AM [place] [facility]

## 2019-09-26 NOTE — Progress Notes (Signed)
Pt supined without any complications.   

## 2019-09-26 NOTE — Progress Notes (Signed)
Pt head turned to the right. Arms re-positioned. No complications

## 2019-09-27 DIAGNOSIS — E1165 Type 2 diabetes mellitus with hyperglycemia: Secondary | ICD-10-CM

## 2019-09-27 LAB — GLUCOSE, CAPILLARY
Glucose-Capillary: 203 mg/dL — ABNORMAL HIGH (ref 70–99)
Glucose-Capillary: 184 mg/dL — ABNORMAL HIGH (ref 70–99)
Glucose-Capillary: 190 mg/dL — ABNORMAL HIGH (ref 70–99)
Glucose-Capillary: 187 mg/dL — ABNORMAL HIGH (ref 70–99)
Glucose-Capillary: 208 mg/dL — ABNORMAL HIGH (ref 70–99)
Glucose-Capillary: 184 mg/dL — ABNORMAL HIGH (ref 70–99)

## 2019-09-27 LAB — CBC WITH DIFFERENTIAL/PLATELET
Abs Immature Granulocytes: 0.18 10*3/uL — ABNORMAL HIGH (ref 0.00–0.07)
Basophils Absolute: 0 10*3/uL (ref 0.0–0.1)
Basophils Relative: 0 %
Eosinophils Absolute: 0 10*3/uL (ref 0.0–0.5)
Eosinophils Relative: 0 %
HCT: 37.4 % (ref 36.0–46.0)
Hemoglobin: 11.2 g/dL — ABNORMAL LOW (ref 12.0–15.0)
Immature Granulocytes: 3 %
Lymphocytes Relative: 14 %
Lymphs Abs: 0.8 10*3/uL (ref 0.7–4.0)
MCH: 20.9 pg — ABNORMAL LOW (ref 26.0–34.0)
MCHC: 29.9 g/dL — ABNORMAL LOW (ref 30.0–36.0)
MCV: 69.6 fL — ABNORMAL LOW (ref 80.0–100.0)
Monocytes Absolute: 0.6 10*3/uL (ref 0.1–1.0)
Monocytes Relative: 11 %
Neutro Abs: 4 10*3/uL (ref 1.7–7.7)
Neutrophils Relative %: 72 %
Platelets: 307 10*3/uL (ref 150–400)
RBC: 5.37 MIL/uL — ABNORMAL HIGH (ref 3.87–5.11)
RDW: 18.2 % — ABNORMAL HIGH (ref 11.5–15.5)
WBC: 5.6 10*3/uL (ref 4.0–10.5)
nRBC: 0.4 % — ABNORMAL HIGH (ref 0.0–0.2)

## 2019-09-27 LAB — COMPREHENSIVE METABOLIC PANEL
ALT: 29 U/L (ref 0–44)
AST: 35 U/L (ref 15–41)
Albumin: 2.4 g/dL — ABNORMAL LOW (ref 3.5–5.0)
Alkaline Phosphatase: 46 U/L (ref 38–126)
Anion gap: 13 (ref 5–15)
BUN: 26 mg/dL — ABNORMAL HIGH (ref 6–20)
CO2: 24 mmol/L (ref 22–32)
Calcium: 8.6 mg/dL — ABNORMAL LOW (ref 8.9–10.3)
Chloride: 103 mmol/L (ref 98–111)
Creatinine, Ser: 0.76 mg/dL (ref 0.44–1.00)
GFR calc Af Amer: >60 mL/min (ref >60)
GFR calc non Af Amer: >60 mL/min (ref >60)
Glucose, Bld: 191 mg/dL — ABNORMAL HIGH (ref 70–99)
Potassium: 4.4 mmol/L (ref 3.5–5.1)
Sodium: 140 mmol/L (ref 135–145)
Total Bilirubin: 0.5 mg/dL (ref 0.3–1.2)
Total Protein: 5.5 g/dL — ABNORMAL LOW (ref 6.5–8.1)

## 2019-09-27 LAB — POCT I-STAT 7, (LYTES, BLD GAS, ICA,H+H)
Acid-Base Excess: 1 mmol/L (ref 0.0–2.0)
Bicarbonate: 28 mmol/L (ref 20.0–28.0)
Calcium, Ion: 1.2 mmol/L (ref 1.15–1.40)
HCT: 40 % (ref 36.0–46.0)
Hemoglobin: 13.6 g/dL (ref 12.0–15.0)
O2 Saturation: 96 %
Patient temperature: 99.7
Potassium: 4.1 mmol/L (ref 3.5–5.1)
Sodium: 140 mmol/L (ref 135–145)
TCO2: 30 mmol/L (ref 22–32)
pCO2 arterial: 57.8 mmHg — ABNORMAL HIGH (ref 32.0–48.0)
pH, Arterial: 7.297 — ABNORMAL LOW (ref 7.350–7.450)
pO2, Arterial: 94 mmHg (ref 83.0–108.0)

## 2019-09-27 LAB — C-REACTIVE PROTEIN: CRP: 3 mg/dL — ABNORMAL HIGH (ref <1.0)

## 2019-09-27 LAB — BASIC METABOLIC PANEL
Anion gap: 9 (ref 5–15)
BUN: 37 mg/dL — ABNORMAL HIGH (ref 6–20)
CO2: 27 mmol/L (ref 22–32)
Calcium: 8.8 mg/dL — ABNORMAL LOW (ref 8.9–10.3)
Chloride: 103 mmol/L (ref 98–111)
Creatinine, Ser: 0.99 mg/dL (ref 0.44–1.00)
GFR calc Af Amer: >60 mL/min (ref >60)
GFR calc non Af Amer: >60 mL/min (ref >60)
Glucose, Bld: 216 mg/dL — ABNORMAL HIGH (ref 70–99)
Potassium: 4.5 mmol/L (ref 3.5–5.1)
Sodium: 139 mmol/L (ref 135–145)

## 2019-09-27 LAB — MAGNESIUM
Magnesium: 2.5 mg/dL — ABNORMAL HIGH (ref 1.7–2.4)
Magnesium: 2.6 mg/dL — ABNORMAL HIGH (ref 1.7–2.4)

## 2019-09-27 LAB — PHOSPHORUS: Phosphorus: 4.1 mg/dL (ref 2.5–4.6)

## 2019-09-27 LAB — FERRITIN: Ferritin: 106 ng/mL (ref 11–307)

## 2019-09-27 LAB — D-DIMER, QUANTITATIVE: D-Dimer, Quant: 0.62 ug/mL-FEU — ABNORMAL HIGH (ref 0.00–0.50)

## 2019-09-27 MED ORDER — FUROSEMIDE 10 MG/ML IJ SOLN
40.0000 mg | Freq: Once | INTRAMUSCULAR | Status: AC
  Administered 2019-09-27: 40 mg via INTRAVENOUS
  Filled 2019-09-27: qty 4

## 2019-09-27 MED ORDER — INSULIN GLARGINE 100 UNIT/ML ~~LOC~~ SOLN
15.0000 [IU] | Freq: Every day | SUBCUTANEOUS | Status: DC
  Administered 2019-09-27 – 2019-09-28 (×2): 15 [IU] via SUBCUTANEOUS
  Filled 2019-09-27 (×2): qty 0.15

## 2019-09-27 MED ORDER — DILTIAZEM HCL-DEXTROSE 125-5 MG/125ML-% IV SOLN (PREMIX)
5.0000 mg/h | INTRAVENOUS | Status: DC
  Administered 2019-09-27: 5 mg/h via INTRAVENOUS
  Filled 2019-09-27: qty 125

## 2019-09-27 MED ORDER — DILTIAZEM 12 MG/ML ORAL SUSPENSION
30.0000 mg | Freq: Four times a day (QID) | ORAL | Status: DC
  Administered 2019-09-27: 30 mg
  Filled 2019-09-27 (×3): qty 3

## 2019-09-27 NOTE — Progress Notes (Signed)
NAME:  Summer Guzman, MRN:  [id number], DOB:  02-17-1966, LOS: 4 ADMISSION DATE:  Oct 07, 2019, CONSULTATION DATE:  3/22 REFERRING MD: Butler Denmark, CHIEF COMPLAINT:  Dyspnea   Brief History   54 y/o female admitted to [facility] with ARDS due to COVID pneumonia on 3/21.  Intubated 3/22.  History of present illness   This is a pleasant 54 y/o female who tested positive for COVID on 3/17.  She believed she became infected after exposure to sick family.  She presented to [facility] on 3/21 with dyspnea, cough, found to be very hypoxemic.  Received Actemra, remdesivir, decadron.  Her respiratory rate continued to climb after admission and her O2 saturation dropped.  She complained of feeling more dyspneic.  After discussion with the patient it was decided to place her on mechanical ventilation. Developed atrial fib with RVR.   Past Medical History  asthma  Significant Hospital Events   3/21 admission 3/22 intubation, transfer from [facility] to [facility]  Consults:  PCCM  Procedures:  3/22 ETT >  3/22 L IJ CVL >  3/22 radial A-line>  Significant Diagnostic Tests:  3/21 TTE> LVEF 70-75%, RV function normal, valves OK,   Micro Data:  3/17 SARS COV 2 > positive outside [place] 3/21 blood >  3/22 sputum>   Antimicrobials/COVID Rx:  3/21 cefepime x1 3/21 tocilizumab x1 3/21 remdesivir >  3/21 decadron >   Interim history/subjective:  Supinated overnight, P:F ratio significantly improved  Objective   Blood pressure 104/65, pulse 83, temperature 98.3 F (36.8 C), temperature source Oral, resp. rate 12, height 5\' 6"  (1.676 m), weight (!) 150.8 kg, last menstrual period 09/21/2019, SpO2 (!) 89 %.    Vent Mode: PRVC FiO2 (%):  [50 %] 50 % Set Rate:  [24 bmp] 24 bmp Vt Set:  [470 mL] 470 mL PEEP:  [8 cmH20-14 cmH20] 8 cmH20 Plateau Pressure:  [19 cmH20-33 cmH20] 23 cmH20   Intake/Output Summary (Last 24 hours) at 09/27/2019 0936 Last data filed at 09/27/2019 0820 Gross per 24 hour  Intake 1817.28 ml    Output 2085 ml  Net -267.72 ml   Filed Weights   10-07-2019 0855 2019-10-07 1600 09/26/19 0500  Weight: (!) 137 kg (!) 138.7 kg (!) 150.8 kg    Examination:  General: critically ill-appearing woman intubated, sedated, proned HENT: [place]/AT, eyes anicteric PULM: CTAB, breathing comfortably on PS 8 + CPAP 8, 50% CV: irreg rhythm, reg rate, Afib on tele GI: obese, soft, NT, ND Extremities: appropriate muscle mass for age, no c/c/e Neuro: RASS -3   Resolved Hospital Problem list    Assessment & Plan:  Severe ARDS due to COVID 19 pneumonia: -Continue low tidal volume ventilation, 4 to 8 cc/kg ideal body weight with goal driving pressure less than 30 and plateau pressure less than 15.  Currently meeting goals.  Permissive hypercapnia okay.  Titrate PEEP and FiO2 per ARDS ladder. -No longer requiring proning -Conservative fluid strategy.  -VAP prevention protocol -Previously received tocilizumab -Continue remdesivir and Decadron per protocol  Need for sedation for mechanical ventilation -Target RASS -3 and vent synchrony. -Continue fentanyl and propofol, PAD protocol  Nutrition: -Continue tube feeds and free water flushes -Continue monitoring electrolytes  Atrial fib with RVR, new onset. Back in NSR. -Continue telemetry monitoring -Continue enteral diltiazem for rate control <120. -Continue Lovenox twice daily  DM2 -Continue Lantus-increase to 15 units once daily -Accu-Cheks every 4 hours with sliding scale insulin as needed -Goal blood glucose 140-180 while admitted to the ICU  Acute anemia likely due to critical illness -Transfuse for hemoglobin less than 7 -Continue to monitor  Best practice:  Diet: tube feeding  Pain/Anxiety/Delirium protocol (if indicated): ordered  VAP protocol (if indicated): yes DVT prophylaxis: lovenox GI prophylaxis: PPI   Glucose control: SSI Mobility: bed rest Code Status: full Family Communication: sister Suzette Disposition: ICU  Labs    CBC: Recent Labs  Lab 09/24/19 0322 09/25/19 0006 09/25/19 0419 09/25/19 0419 09/25/19 0428 09/25/19 0428 09/25/19 1458 09/25/19 1748 09/26/19 0451 09/26/19 1832 09/27/19 0439  WBC 2.2*  --  3.5*  --  3.7*  --   --   --  4.4  --  5.6  NEUTROABS 1.3*  --  2.6  --  2.4  --   --   --  3.2  --  4.0  HGB 13.3   < > 12.5   < > 12.3   < > 13.3 13.3 11.7* 12.6 11.2*  HCT 43.5   < > 41.1   < > 40.7   < > 39.0 39.0 38.4 37.0 37.4  MCV 67.3*  --  68.5*  --  68.6*  --   --   --  69.2*  --  69.6*  PLT 236  --  286  --  290  --   --   --  299  --  307   < > = values in this interval not displayed.    Basic Metabolic Panel: Recent Labs  Lab 09/19/2019 1922 09/22/2019 1922 09/24/19 0322 09/24/19 1700 09/25/19 0006 09/25/19 0428 09/25/19 0428 09/25/19 1458 09/25/19 1748 09/26/19 0451 09/26/19 1832 09/27/19 0439  NA 138   < > 139  --    < > 139   < > 137 138 137 139 140  K 3.6   < > 4.1  --    < > 4.3   < > 3.9 3.8 4.2 3.8 4.4  CL 105  --  109  --   --  107  --   --   --  104  --  103  CO2 21*  --  20*  --   --  20*  --   --   --  22  --  24  GLUCOSE 138*  --  170*  --   --  200*  --   --   --  201*  --  191*  BUN 15  --  17  --   --  18  --   --   --  18  --  26*  CREATININE 0.65  --  0.62  --   --  0.80  --   --   --  0.62  --  0.76  CALCIUM 8.2*  --  8.3*  --   --  8.8*  --   --   --  8.7*  --  8.6*  MG 2.5*   < > 2.6* 2.6*  --  2.6*  --   --   --  2.6*  --  2.5*  PHOS  --   --  2.4* 3.1  --  4.4  --   --   --  4.3  --  4.1   < > = values in this interval not displayed.   GFR: Estimated Creatinine Clearance: 124.5 mL/min (by C-G formula based on SCr of 0.76 mg/dL). Recent Labs  Lab 09/15/2019 0908 09/08/2019 1922 09/24/19 0322 09/25/19 0419 09/25/19 0428 09/26/19 0451 09/27/19 [place]  5.00  --   --   --   --   --   --   WBC 5.9  --    < > 3.5* 3.7* 4.4 5.6  LATICACIDVEN 2.6* 1.4  --   --   --   --   --    < > = values in this interval not displayed.    Liver  Function Tests: Recent Labs  Lab 10/03/2019 0908 09/24/19 0322 09/25/19 0428 09/26/19 0451 09/27/19 0439  AST 77* 61* 41 32 35  ALT 46* 40 34 31 29  ALKPHOS 70 63 58 50 46  BILITOT 0.6 0.5 0.5 0.5 0.5  PROT 7.5 6.5 6.6 5.8* 5.5*  ALBUMIN 3.3* 2.7* 2.7* 2.5* 2.4*   No results for input(s): LIPASE, AMYLASE in the last 168 hours. No results for input(s): AMMONIA in the last 168 hours.  ABG    Component Value Date/Time   PHART 7.370 09/26/2019 1832   PCO2ART 42.7 09/26/2019 1832   PO2ART 158.0 (H) 09/26/2019 1832   HCO3 24.7 09/26/2019 1832   TCO2 26 09/26/2019 1832   ACIDBASEDEF 1.0 09/26/2019 1832   O2SAT 99.0 09/26/2019 1832     Coagulation Profile: Recent Labs  Lab 09/03/2019 0908  INR 1.0    Cardiac Enzymes: No results for input(s): CKTOTAL, CKMB, CKMBINDEX, TROPONINI in the last 168 hours.  HbA1C: Hgb A1c MFr Bld  Date/Time Value Ref Range Status  09/27/2019 05:02 PM 6.4 (H) 4.8 - 5.6 % Final    Comment:    (NOTE) Pre diabetes:          5.7%-6.4% Diabetes:              >6.4% Glycemic control for   <7.0% adults with diabetes     CBG: Recent Labs  Lab 09/26/19 1528 09/26/19 1930 09/26/19 2321 09/27/19 0321 09/27/19 0737  GLUCAP 197* 169* 187* 203* 184*    This patient is critically ill with multiple organ system failure which requires frequent high complexity decision making, assessment, support, evaluation, and titration of therapies. This was completed through the application of advanced monitoring technologies and extensive interpretation of multiple databases. During this encounter critical care time was devoted to patient care services described in this note for 45 minutes.    Steffanie Dunn, DO 09/27/19 9:36 AM [place] [facility]

## 2019-09-27 NOTE — Progress Notes (Signed)
Pt biting ett, bite block placed at this time. Pt also placed back on full support due to needing increased sedation due to agitation.

## 2019-09-27 NOTE — Progress Notes (Signed)
eLink Physician-Brief Progress Note Patient Name: Summer Guzman DOB: 09/04/1965 MRN: [id number]   Date of Service  09/27/2019  HPI/Events of Note  AFIB with RVR = Ventricular rate = 133. Patient on Cardizem per tube. BP = 114/71 with MAP = 90.   eICU Interventions  Will order: 1. D/C Cardizem per tube.  2. Cardizem IV infusion. Titrate to HE 65-105.  3. BMP and Mg++ level STAT.      Intervention Category Major Interventions: Arrhythmia - evaluation and management  Whitni Pasquini Dennard Nip 09/27/2019, 8:51 PM

## 2019-09-28 ENCOUNTER — Inpatient Hospital Stay (HOSPITAL_COMMUNITY): Payer: No Typology Code available for payment source

## 2019-09-28 DIAGNOSIS — E119 Type 2 diabetes mellitus without complications: Secondary | ICD-10-CM

## 2019-09-28 LAB — COMPREHENSIVE METABOLIC PANEL
ALT: 49 U/L — ABNORMAL HIGH (ref 0–44)
AST: 65 U/L — ABNORMAL HIGH (ref 15–41)
Albumin: 2.9 g/dL — ABNORMAL LOW (ref 3.5–5.0)
Alkaline Phosphatase: 52 U/L (ref 38–126)
Anion gap: 15 (ref 5–15)
BUN: 48 mg/dL — ABNORMAL HIGH (ref 6–20)
CO2: 26 mmol/L (ref 22–32)
Calcium: 8.9 mg/dL (ref 8.9–10.3)
Chloride: 100 mmol/L (ref 98–111)
Creatinine, Ser: 1.2 mg/dL — ABNORMAL HIGH (ref 0.44–1.00)
GFR calc Af Amer: >60 mL/min (ref >60)
GFR calc non Af Amer: 52 mL/min — ABNORMAL LOW (ref >60)
Glucose, Bld: 179 mg/dL — ABNORMAL HIGH (ref 70–99)
Potassium: 4.7 mmol/L (ref 3.5–5.1)
Sodium: 141 mmol/L (ref 135–145)
Total Bilirubin: 0.5 mg/dL (ref 0.3–1.2)
Total Protein: 6.3 g/dL — ABNORMAL LOW (ref 6.5–8.1)

## 2019-09-28 LAB — POCT I-STAT 7, (LYTES, BLD GAS, ICA,H+H)
Acid-Base Excess: 2 mmol/L (ref 0.0–2.0)
Bicarbonate: 30.3 mmol/L — ABNORMAL HIGH (ref 20.0–28.0)
Calcium, Ion: 1.26 mmol/L (ref 1.15–1.40)
HCT: 40 % (ref 36.0–46.0)
Hemoglobin: 13.6 g/dL (ref 12.0–15.0)
O2 Saturation: 92 %
Potassium: 4.4 mmol/L (ref 3.5–5.1)
Sodium: 139 mmol/L (ref 135–145)
TCO2: 32 mmol/L (ref 22–32)
pCO2 arterial: 61.9 mmHg — ABNORMAL HIGH (ref 32.0–48.0)
pH, Arterial: 7.298 — ABNORMAL LOW (ref 7.350–7.450)
pO2, Arterial: 72 mmHg — ABNORMAL LOW (ref 83.0–108.0)

## 2019-09-28 LAB — CULTURE, BLOOD (ROUTINE X 2)
Culture: NO GROWTH
Culture: NO GROWTH

## 2019-09-28 LAB — MAGNESIUM: Magnesium: 2.8 mg/dL — ABNORMAL HIGH (ref 1.7–2.4)

## 2019-09-28 LAB — D-DIMER, QUANTITATIVE: D-Dimer, Quant: 1.12 ug/mL-FEU — ABNORMAL HIGH (ref 0.00–0.50)

## 2019-09-28 LAB — GLUCOSE, CAPILLARY
Glucose-Capillary: 155 mg/dL — ABNORMAL HIGH (ref 70–99)
Glucose-Capillary: 203 mg/dL — ABNORMAL HIGH (ref 70–99)
Glucose-Capillary: 204 mg/dL — ABNORMAL HIGH (ref 70–99)
Glucose-Capillary: 227 mg/dL — ABNORMAL HIGH (ref 70–99)
Glucose-Capillary: 214 mg/dL — ABNORMAL HIGH (ref 70–99)
Glucose-Capillary: 224 mg/dL — ABNORMAL HIGH (ref 70–99)

## 2019-09-28 LAB — CBC WITH DIFFERENTIAL/PLATELET
Abs Immature Granulocytes: 0.63 10*3/uL — ABNORMAL HIGH (ref 0.00–0.07)
Basophils Absolute: 0 10*3/uL (ref 0.0–0.1)
Basophils Relative: 0 %
Eosinophils Absolute: 0 10*3/uL (ref 0.0–0.5)
Eosinophils Relative: 0 %
HCT: 40.6 % (ref 36.0–46.0)
Hemoglobin: 12 g/dL (ref 12.0–15.0)
Immature Granulocytes: 5 %
Lymphocytes Relative: 10 %
Lymphs Abs: 1.1 10*3/uL (ref 0.7–4.0)
MCH: 21 pg — ABNORMAL LOW (ref 26.0–34.0)
MCHC: 29.6 g/dL — ABNORMAL LOW (ref 30.0–36.0)
MCV: 71 fL — ABNORMAL LOW (ref 80.0–100.0)
Monocytes Absolute: 1.3 10*3/uL — ABNORMAL HIGH (ref 0.1–1.0)
Monocytes Relative: 11 %
Neutro Abs: 8.8 10*3/uL — ABNORMAL HIGH (ref 1.7–7.7)
Neutrophils Relative %: 74 %
Platelets: 382 10*3/uL (ref 150–400)
RBC: 5.72 MIL/uL — ABNORMAL HIGH (ref 3.87–5.11)
RDW: 18.6 % — ABNORMAL HIGH (ref 11.5–15.5)
WBC: 11.8 10*3/uL — ABNORMAL HIGH (ref 4.0–10.5)
nRBC: 1 % — ABNORMAL HIGH (ref 0.0–0.2)

## 2019-09-28 LAB — TRIGLYCERIDES: Triglycerides: 284 mg/dL — ABNORMAL HIGH (ref <150)

## 2019-09-28 LAB — URINALYSIS, ROUTINE W REFLEX MICROSCOPIC
Bilirubin Urine: NEGATIVE
Glucose, UA: NEGATIVE mg/dL
Hgb urine dipstick: NEGATIVE
Ketones, ur: NEGATIVE mg/dL
Leukocytes,Ua: NEGATIVE
Nitrite: NEGATIVE
Protein, ur: NEGATIVE mg/dL
Specific Gravity, Urine: 1.027 (ref 1.005–1.030)
pH: 5 (ref 5.0–8.0)

## 2019-09-28 LAB — PHOSPHORUS: Phosphorus: 6.8 mg/dL — ABNORMAL HIGH (ref 2.5–4.6)

## 2019-09-28 LAB — FERRITIN: Ferritin: 99 ng/mL (ref 11–307)

## 2019-09-28 LAB — C-REACTIVE PROTEIN: CRP: 3.4 mg/dL — ABNORMAL HIGH (ref <1.0)

## 2019-09-28 MED ORDER — STERILE WATER FOR INJECTION IJ SOLN
INTRAMUSCULAR | Status: AC
  Filled 2019-09-28: qty 10

## 2019-09-28 MED ORDER — ARTIFICIAL TEARS OPHTHALMIC OINT
1.0000 "application " | TOPICAL_OINTMENT | Freq: Three times a day (TID) | OPHTHALMIC | Status: DC
  Administered 2019-09-28 – 2019-10-03 (×15): 1 via OPHTHALMIC
  Filled 2019-09-28 (×3): qty 3.5

## 2019-09-28 MED ORDER — POLYETHYLENE GLYCOL 3350 17 G PO PACK
17.0000 g | PACK | Freq: Every day | ORAL | Status: DC
  Administered 2019-09-28 – 2019-10-02 (×5): 17 g
  Filled 2019-09-28 (×5): qty 1

## 2019-09-28 MED ORDER — DILTIAZEM 12 MG/ML ORAL SUSPENSION
15.0000 mg | Freq: Four times a day (QID) | ORAL | Status: DC | PRN
  Filled 2019-09-28: qty 3

## 2019-09-28 MED ORDER — INSULIN GLARGINE 100 UNIT/ML ~~LOC~~ SOLN
20.0000 [IU] | Freq: Every day | SUBCUTANEOUS | Status: DC
  Administered 2019-09-29 – 2019-10-01 (×3): 20 [IU] via SUBCUTANEOUS
  Filled 2019-09-28 (×4): qty 0.2

## 2019-09-28 MED ORDER — VECURONIUM BROMIDE 10 MG IV SOLR
10.0000 mg | INTRAVENOUS | Status: DC | PRN
  Administered 2019-09-28 (×2): 10 mg via INTRAVENOUS
  Filled 2019-09-28 (×2): qty 10

## 2019-09-28 MED ORDER — SENNOSIDES-DOCUSATE SODIUM 8.6-50 MG PO TABS
1.0000 | ORAL_TABLET | Freq: Every day | ORAL | Status: DC
  Administered 2019-09-28 – 2019-10-08 (×9): 1
  Filled 2019-09-28 (×8): qty 1

## 2019-09-28 MED ORDER — DILTIAZEM 12 MG/ML ORAL SUSPENSION
45.0000 mg | Freq: Four times a day (QID) | ORAL | Status: DC
  Administered 2019-09-28 – 2019-09-29 (×4): 45 mg
  Filled 2019-09-28 (×5): qty 6

## 2019-09-28 MED ORDER — INSULIN ASPART 100 UNIT/ML ~~LOC~~ SOLN
2.0000 [IU] | SUBCUTANEOUS | Status: DC
  Administered 2019-09-28 – 2019-10-03 (×26): 2 [IU] via SUBCUTANEOUS

## 2019-09-28 NOTE — Progress Notes (Signed)
NAME:  Summer Guzman, MRN:  [id number], DOB:  June 11, 1966, LOS: 5 ADMISSION DATE:  10/03/2019, CONSULTATION DATE:  3/22 REFERRING MD: Wynelle Cleveland, CHIEF COMPLAINT:  Dyspnea   Brief History   54 y/o female admitted to [facility] with ARDS due to COVID pneumonia on 3/21.  Intubated 3/22.  History of present illness   This is a pleasant 54 y/o female who tested positive for COVID on 3/17.  She believed she became infected after exposure to sick family.  She presented to [facility] on 3/21 with dyspnea, cough, found to be very hypoxemic.  Received Actemra, remdesivir, decadron.  Her respiratory rate continued to climb after admission and her O2 saturation dropped.  She complained of feeling more dyspneic.  After discussion with the patient it was decided to place her on mechanical ventilation. Developed atrial fib with RVR.   Past Medical History  asthma  Significant Hospital Events   3/21 admission 3/22 intubation, transfer from [facility] to [facility]  Consults:  PCCM  Procedures:  3/22 ETT >  3/22 L IJ CVL >  3/22 radial A-line>  Significant Diagnostic Tests:  3/21 TTE> LVEF 70-75%, RV function normal, valves OK,   Micro Data:  3/17 SARS COV 2 > positive outside [place] 3/21 blood >  3/22 sputum> NG   Antimicrobials/COVID Rx:  3/21 cefepime x1 3/21 tocilizumab x1 3/21 remdesivir >  3/21 decadron >   Interim history/subjective:  Remained supinated yesterday. Very dyssynchronous yesterday afternoon and overnight.  Objective   Blood pressure 100/66, pulse 86, temperature 98.4 F (36.9 C), temperature source Axillary, resp. rate 16, height 5\' 6"  (1.676 m), weight (!) 150.8 kg, last menstrual period 09/21/2019, SpO2 92 %.    Vent Mode: PRVC FiO2 (%):  [50 %-60 %] 50 % Set Rate:  [14 bmp] 14 bmp Vt Set:  [470 mL] 470 mL PEEP:  [8 cmH20] 8 cmH20 Pressure Support:  [8 cmH20] 8 cmH20 Plateau Pressure:  [25 cmH20] 25 cmH20   Intake/Output Summary (Last 24 hours) at 09/28/2019 1023 Last data filed at  09/28/2019 0800 Gross per 24 hour  Intake 2862.94 ml  Output 1725 ml  Net 1137.94 ml   Filed Weights   09/27/2019 0855 09/21/2019 1600 09/26/19 0500  Weight: (!) 137 kg (!) 138.7 kg (!) 150.8 kg    Examination:  General: critically ill appearing woman laying in bed in NAD HENT: [place]/AT eyes anicteric PULM: Clear to auscultation bilaterally, decreased basilar breath sounds.  Breathing dyssynchrony on the vent. No tracheal secretions.  P plat 20 CV: Irregularly irregular rhythm, tachycardic, no murmurs GI:, Soft, nontender, nondistended Extremities: Appropriate muscle mass for age, no clubbing or cyanosis Neuro: Pinpoint pupils bilaterally, RASS -5, not withdrawing any extremity, breathing above the vent   Resolved Hospital Problem list    Assessment & Plan:  Severe ARDS due to COVID 19 pneumonia: -Continue low tidal volume ventilation, 4 to 8 cc/kg IBW with goal driving pressure less than 15 and plateau pressure less than 30. -No longer requiring proning -Conservative fluid strategy.  Holding off on diuresis today given mild bump in BUN and creatinine -VAP prevention protocol -Continue Decadron per protocol. -Previously received tocilizumab and remdesivir x 5 days  Need for sedation for mechanical ventilation -Target RASS -5 and vent synchrony -Continue fentanyl and propofol per pad protocol  Nutrition: -Continue tube feeds and free water flushes -Continue monitoring electrolytes  Atrial fib with RVR, new onset. Back in NSR. -Continue telemetry monitoring -Continue enteral diltiazem for rate control less than  120-starting and increased dose of oral meds. DC IV infusion. -Continue Lovenox twice daily  DM2 -Continue Lantus-increase to 20 units once daily -Accu-Cheks every 4 hours with sliding scale insulin as needed. Adding 2 units Q4h TF coverage. -Goal blood glucose 140-180 while admitted to the ICU  Acute anemia likely due to critical illness -Transfuse for hemoglobin less  than 7 -Continue to monitor  Leukocytosis -UA -CXR -Trach aspirate  Elevated transaminase level- possibly due to course of remdesivir -con't to monitor   Best practice:  Diet: tube feeding  Pain/Anxiety/Delirium protocol (if indicated): ordered  VAP protocol (if indicated): yes DVT prophylaxis: lovenox GI prophylaxis: PPI   Glucose control: SSI Mobility: bed rest Code Status: full Family Communication: sister Suzette updated via phone 3/26 Disposition: ICU  Labs   CBC: Recent Labs  Lab 09/25/19 0419 09/25/19 0419 09/25/19 0428 09/25/19 1458 09/26/19 0451 09/26/19 0451 09/26/19 1832 09/27/19 0439 09/27/19 2341 09/28/19 0500 09/28/19 1005  WBC 3.5*  --  3.7*  --  4.4  --   --  5.6  --  11.8*  --   NEUTROABS 2.6  --  2.4  --  3.2  --   --  4.0  --  8.8*  --   HGB 12.5   < > 12.3   < > 11.7*   < > 12.6 11.2* 13.6 12.0 13.6  HCT 41.1   < > 40.7   < > 38.4   < > 37.0 37.4 40.0 40.6 40.0  MCV 68.5*  --  68.6*  --  69.2*  --   --  69.6*  --  71.0*  --   PLT 286  --  290  --  299  --   --  307  --  382  --    < > = values in this interval not displayed.    Basic Metabolic Panel: Recent Labs  Lab 09/24/19 1700 09/25/19 0006 09/25/19 0428 09/25/19 1458 09/26/19 0451 09/26/19 1832 09/27/19 0439 09/27/19 2130 09/27/19 2341 09/28/19 0500 09/28/19 1005  NA  --    < > 139   < > 137   < > 140 139 140 141 139  K  --    < > 4.3   < > 4.2   < > 4.4 4.5 4.1 4.7 4.4  CL  --   --  107  --  104  --  103 103  --  100  --   CO2  --   --  20*  --  22  --  24 27  --  26  --   GLUCOSE  --   --  200*  --  201*  --  191* 216*  --  179*  --   BUN  --   --  18  --  18  --  26* 37*  --  48*  --   CREATININE  --   --  0.80  --  0.62  --  0.76 0.99  --  1.20*  --   CALCIUM  --   --  8.8*  --  8.7*  --  8.6* 8.8*  --  8.9  --   MG 2.6*  --  2.6*  --  2.6*  --  2.5* 2.6*  --  2.8*  --   PHOS 3.1  --  4.4  --  4.3  --  4.1  --   --  6.8*  --    < > =  values in this interval not  displayed.   GFR: Estimated Creatinine Clearance: 83 mL/min (A) (by C-G formula based on SCr of 1.2 mg/dL (H)). Recent Labs  Lab October 19, 2019 0908 October 19, 2019 1922 09/24/19 0322 09/25/19 0428 09/26/19 0451 09/27/19 0439 09/28/19 0500  PROCALCITON 5.00  --   --   --   --   --   --   WBC 5.9  --    < > 3.7* 4.4 5.6 11.8*  LATICACIDVEN 2.6* 1.4  --   --   --   --   --    < > = values in this interval not displayed.    Liver Function Tests: Recent Labs  Lab 09/24/19 0322 09/25/19 0428 09/26/19 0451 09/27/19 0439 09/28/19 0500  AST 61* 41 32 35 65*  ALT 40 34 31 29 49*  ALKPHOS 63 58 50 46 52  BILITOT 0.5 0.5 0.5 0.5 0.5  PROT 6.5 6.6 5.8* 5.5* 6.3*  ALBUMIN 2.7* 2.7* 2.5* 2.4* 2.9*   No results for input(s): LIPASE, AMYLASE in the last 168 hours. No results for input(s): AMMONIA in the last 168 hours.  ABG    Component Value Date/Time   PHART 7.298 (L) 09/28/2019 1005   PCO2ART 61.9 (H) 09/28/2019 1005   PO2ART 72.0 (L) 09/28/2019 1005   HCO3 30.3 (H) 09/28/2019 1005   TCO2 32 09/28/2019 1005   ACIDBASEDEF 1.0 09/26/2019 1832   O2SAT 92.0 09/28/2019 1005     Coagulation Profile: Recent Labs  Lab 19-Oct-2019 0908  INR 1.0    Cardiac Enzymes: No results for input(s): CKTOTAL, CKMB, CKMBINDEX, TROPONINI in the last 168 hours.  HbA1C: Hgb A1c MFr Bld  Date/Time Value Ref Range Status  October 19, 2019 05:02 PM 6.4 (H) 4.8 - 5.6 % Final    Comment:    (NOTE) Pre diabetes:          5.7%-6.4% Diabetes:              >6.4% Glycemic control for   <7.0% adults with diabetes     CBG: Recent Labs  Lab 09/27/19 1624 09/27/19 1936 09/27/19 2328 09/28/19 0346 09/28/19 0757  GLUCAP 187* 208* 184* 155* 203*    This patient is critically ill with multiple organ system failure which requires frequent high complexity decision making, assessment, support, evaluation, and titration of therapies. This was completed through the application of advanced monitoring technologies and  extensive interpretation of multiple databases. During this encounter critical care time was devoted to patient care services described in this note for 33 minutes.    Steffanie Dunn, DO 09/28/19 11:42 AM [facility]

## 2019-09-29 LAB — POCT I-STAT 7, (LYTES, BLD GAS, ICA,H+H)
Acid-Base Excess: 5 mmol/L — ABNORMAL HIGH (ref 0.0–2.0)
Acid-Base Excess: 7 mmol/L — ABNORMAL HIGH (ref 0.0–2.0)
Bicarbonate: 31.9 mmol/L — ABNORMAL HIGH (ref 20.0–28.0)
Bicarbonate: 32 mmol/L — ABNORMAL HIGH (ref 20.0–28.0)
Calcium, Ion: 1.24 mmol/L (ref 1.15–1.40)
Calcium, Ion: 1.25 mmol/L (ref 1.15–1.40)
HCT: 35 % — ABNORMAL LOW (ref 36.0–46.0)
HCT: 39 % (ref 36.0–46.0)
Hemoglobin: 11.9 g/dL — ABNORMAL LOW (ref 12.0–15.0)
Hemoglobin: 13.3 g/dL (ref 12.0–15.0)
O2 Saturation: 92 %
O2 Saturation: 96 %
Patient temperature: 97.7
Patient temperature: 97.8
Potassium: 3.9 mmol/L (ref 3.5–5.1)
Potassium: 4.4 mmol/L (ref 3.5–5.1)
Sodium: 137 mmol/L (ref 135–145)
Sodium: 140 mmol/L (ref 135–145)
TCO2: 33 mmol/L — ABNORMAL HIGH (ref 22–32)
TCO2: 34 mmol/L — ABNORMAL HIGH (ref 22–32)
pCO2 arterial: 47.1 mmHg (ref 32.0–48.0)
pCO2 arterial: 52.6 mmHg — ABNORMAL HIGH (ref 32.0–48.0)
pH, Arterial: 7.388 (ref 7.350–7.450)
pH, Arterial: 7.438 (ref 7.350–7.450)
pO2, Arterial: 63 mmHg — ABNORMAL LOW (ref 83.0–108.0)
pO2, Arterial: 80 mmHg — ABNORMAL LOW (ref 83.0–108.0)

## 2019-09-29 LAB — CBC WITH DIFFERENTIAL/PLATELET
Abs Immature Granulocytes: 1.08 10*3/uL — ABNORMAL HIGH (ref 0.00–0.07)
Basophils Absolute: 0.1 10*3/uL (ref 0.0–0.1)
Basophils Relative: 0 %
Eosinophils Absolute: 0 10*3/uL (ref 0.0–0.5)
Eosinophils Relative: 0 %
HCT: 36.5 % (ref 36.0–46.0)
Hemoglobin: 11.1 g/dL — ABNORMAL LOW (ref 12.0–15.0)
Immature Granulocytes: 9 %
Lymphocytes Relative: 10 %
Lymphs Abs: 1.3 10*3/uL (ref 0.7–4.0)
MCH: 21.3 pg — ABNORMAL LOW (ref 26.0–34.0)
MCHC: 30.4 g/dL (ref 30.0–36.0)
MCV: 70.1 fL — ABNORMAL LOW (ref 80.0–100.0)
Monocytes Absolute: 0.9 10*3/uL (ref 0.1–1.0)
Monocytes Relative: 7 %
Neutro Abs: 9 10*3/uL — ABNORMAL HIGH (ref 1.7–7.7)
Neutrophils Relative %: 74 %
Platelets: 322 10*3/uL (ref 150–400)
RBC: 5.21 MIL/uL — ABNORMAL HIGH (ref 3.87–5.11)
RDW: 18.2 % — ABNORMAL HIGH (ref 11.5–15.5)
WBC: 12.2 10*3/uL — ABNORMAL HIGH (ref 4.0–10.5)
nRBC: 0.5 % — ABNORMAL HIGH (ref 0.0–0.2)

## 2019-09-29 LAB — GLUCOSE, CAPILLARY
Glucose-Capillary: 169 mg/dL — ABNORMAL HIGH (ref 70–99)
Glucose-Capillary: 165 mg/dL — ABNORMAL HIGH (ref 70–99)
Glucose-Capillary: 162 mg/dL — ABNORMAL HIGH (ref 70–99)
Glucose-Capillary: 201 mg/dL — ABNORMAL HIGH (ref 70–99)
Glucose-Capillary: 206 mg/dL — ABNORMAL HIGH (ref 70–99)
Glucose-Capillary: 189 mg/dL — ABNORMAL HIGH (ref 70–99)

## 2019-09-29 LAB — FERRITIN: Ferritin: 92 ng/mL (ref 11–307)

## 2019-09-29 LAB — COMPREHENSIVE METABOLIC PANEL
ALT: 59 U/L — ABNORMAL HIGH (ref 0–44)
AST: 50 U/L — ABNORMAL HIGH (ref 15–41)
Albumin: 2.7 g/dL — ABNORMAL LOW (ref 3.5–5.0)
Alkaline Phosphatase: 45 U/L (ref 38–126)
Anion gap: 9 (ref 5–15)
BUN: 29 mg/dL — ABNORMAL HIGH (ref 6–20)
CO2: 28 mmol/L (ref 22–32)
Calcium: 8.5 mg/dL — ABNORMAL LOW (ref 8.9–10.3)
Chloride: 102 mmol/L (ref 98–111)
Creatinine, Ser: 0.64 mg/dL (ref 0.44–1.00)
GFR calc Af Amer: >60 mL/min (ref >60)
GFR calc non Af Amer: >60 mL/min (ref >60)
Glucose, Bld: 204 mg/dL — ABNORMAL HIGH (ref 70–99)
Potassium: 4.2 mmol/L (ref 3.5–5.1)
Sodium: 139 mmol/L (ref 135–145)
Total Bilirubin: 0.4 mg/dL (ref 0.3–1.2)
Total Protein: 5.5 g/dL — ABNORMAL LOW (ref 6.5–8.1)

## 2019-09-29 LAB — D-DIMER, QUANTITATIVE: D-Dimer, Quant: 0.55 ug/mL-FEU — ABNORMAL HIGH (ref 0.00–0.50)

## 2019-09-29 MED ORDER — DILTIAZEM 12 MG/ML ORAL SUSPENSION
30.0000 mg | Freq: Four times a day (QID) | ORAL | Status: DC
  Filled 2019-09-29 (×4): qty 3

## 2019-09-29 MED ORDER — FREE WATER
200.0000 mL | Freq: Four times a day (QID) | Status: DC
  Administered 2019-09-29 – 2019-10-01 (×9): 200 mL

## 2019-09-29 NOTE — Progress Notes (Signed)
NAME:  Summer Guzman, MRN:  [id number], DOB:  07/09/1965, LOS: 6 ADMISSION DATE:  06-Oct-2019, CONSULTATION DATE:  3/22 REFERRING MD: Butler Denmark, CHIEF COMPLAINT:  Dyspnea   Brief History   54 y/o female admitted to [facility] with ARDS due to COVID pneumonia on 3/21.  Intubated 3/22.  History of present illness   This is a pleasant 54 y/o female who tested positive for COVID on 3/17.  She believed she became infected after exposure to sick family.  She presented to [facility] on 3/21 with dyspnea, cough, found to be very hypoxemic.  Received Actemra, remdesivir, decadron.  Her respiratory rate continued to climb after admission and her O2 saturation dropped.  She complained of feeling more dyspneic.  After discussion with the patient it was decided to place her on mechanical ventilation. Developed atrial fib with RVR.   Past Medical History  asthma  Significant Hospital Events   3/21 admission 3/22 intubation, transfer from [facility] to [facility]  Consults:  PCCM  Procedures:  3/22 ETT >  3/22 L IJ CVL >  3/22 radial A-line>  Significant Diagnostic Tests:  3/21 TTE> LVEF 70-75%, RV function normal, valves OK  Micro Data:  3/17 SARS COV 2 > positive outside [place] 3/21 blood >  3/22 sputum> NG   Antimicrobials/COVID Rx:  3/21 cefepime x1 3/21 tocilizumab x1 3/21 remdesivir >  3/21 decadron >   Interim history/subjective:  Was dyssynchronous with the vent overnight.  Sedated and paralyzed with versed and vec push at 2020 pm.    Objective   Blood pressure (!) 143/79, pulse 62, temperature (!) 97.2 F (36.2 C), temperature source Axillary, resp. rate (!) 22, height 5\' 6"  (1.676 m), weight (!) 150.8 kg, last menstrual period 09/21/2019, SpO2 98 %.    Vent Mode: PRVC FiO2 (%):  [40 %-50 %] 40 % Set Rate:  [22 bmp] 22 bmp Vt Set:  [470 mL] 470 mL PEEP:  [8 cmH20] 8 cmH20 Plateau Pressure:  [22 cmH20-23 cmH20] 22 cmH20   Intake/Output Summary (Last 24 hours) at 09/29/2019 0807 Last data filed at  09/29/2019 0100 Gross per 24 hour  Intake 1757.96 ml  Output 1745 ml  Net 12.96 ml   Filed Weights   October 06, 2019 0855 10/06/19 1600 09/26/19 0500  Weight: (!) 137 kg (!) 138.7 kg (!) 150.8 kg    Examination:  General: NAD, intubated, opens eyes to voice (on propofol and fentanyl).   HENT: [place]/AT eyes anicteric PULM: Clear to auscultation bilaterally, decreased basilar breath sounds. No current dyssynchrony CV: Normal sinus rhythm today GI:, Soft, nontender, nondistended Extremities: no edema, erythema, no clubbing or cyanosis Neuro: opens eyes to voice, not following commands, withdraws to pain   Resolved Hospital Problem list    Assessment & Plan:  Severe ARDS due to COVID 19 pneumonia: -Continue low tidal volume ventilation, 4 to 8 cc/kg IBW with goal driving pressure less than 15 and plateau pressure less than 30. -daily ABG  -No longer requiring proning -Conservative fluid strategy.  Receiving FW flushes currently, I/O even.  -VAP prevention protocol -Continue Decadron per protocol. (day 7 today) -Previously received tocilizumab, and remdesivir x 5 days lovenox 1mg /kg BID (atrial fib)  Need for sedation for mechanical ventilation -Target RASS 0 -1 now that improved vent mechanics -Continue fentanyl and propofol per pad protocol  Nutrition: -Continue tube feeds and free water flushes (decrease to 200 q6) -Continue monitoring electrolytes  Atrial fib with RVR, new onset. Back in NSR. Slight sinus brady, intermittent tachycardia.  -  Continue telemetry monitoring -Continue enteral diltiazem for rate control, will decrease dose today given episode of brady. . -Continue Lovenox twice daily  HTN: Monitor, intermittent, diltiazem.   Hyperglycemia Possible undx DM.  Currently on decadron.   -Continue Lantus- 20 daily starting today.   -Accu-Cheks every 4 hours with sliding scale insulin as needed. Adding 2 units Q4h TF coverage. -Goal blood glucose 140-180 while admitted to  the ICU  Acute anemia likely due to critical illness -Transfuse for hemoglobin less than 7 -Continue to monitor  Leukocytosis afebrile -UA nl  Cont to monitor for signs of infection.  -Trach aspirate  Elevated transaminase level- possibly due to course of remdesivir -con't to monitor   Hx of Asthma - albuterol prn prior to admission Cont nebs  Best practice:  Diet: tube feeding  Pain/Anxiety/Delirium protocol (if indicated): ordered  VAP protocol (if indicated): yes DVT prophylaxis: lovenox GI prophylaxis: PPI   Glucose control: SSI Mobility: bed rest Code Status: full Family Communication: sister Hector Shade to be called today.  Disposition: ICU  Labs   CBC: Recent Labs  Lab 09/25/19 0428 09/25/19 1458 09/26/19 0451 09/26/19 1832 09/27/19 0439 09/27/19 2341 09/28/19 0500 09/28/19 1005 09/29/19 0445  WBC 3.7*  --  4.4  --  5.6  --  11.8*  --  12.2*  NEUTROABS 2.4  --  3.2  --  4.0  --  8.8*  --  9.0*  HGB 12.3   < > 11.7*   < > 11.2* 13.6 12.0 13.6 11.1*  HCT 40.7   < > 38.4   < > 37.4 40.0 40.6 40.0 36.5  MCV 68.6*  --  69.2*  --  69.6*  --  71.0*  --  70.1*  PLT 290  --  299  --  307  --  382  --  322   < > = values in this interval not displayed.    Basic Metabolic Panel: Recent Labs  Lab 09/24/19 1700 09/25/19 0006 09/25/19 0428 09/25/19 1458 09/26/19 0451 09/26/19 1832 09/27/19 0439 09/27/19 0439 09/27/19 2130 09/27/19 2341 09/28/19 0500 09/28/19 1005 09/29/19 0445  NA  --    < > 139   < > 137   < > 140   < > 139 140 141 139 139  K  --    < > 4.3   < > 4.2   < > 4.4   < > 4.5 4.1 4.7 4.4 4.2  CL  --   --  107  --  104  --  103  --  103  --  100  --  102  CO2  --   --  20*  --  22  --  24  --  27  --  26  --  28  GLUCOSE  --   --  200*  --  201*  --  191*  --  216*  --  179*  --  204*  BUN  --   --  18  --  18  --  26*  --  37*  --  48*  --  29*  CREATININE  --   --  0.80  --  0.62  --  0.76  --  0.99  --  1.20*  --  0.64  CALCIUM  --   --   8.8*  --  8.7*  --  8.6*  --  8.8*  --  8.9  --  8.5*  MG 2.6*  --  2.6*  --  2.6*  --  2.5*  --  2.6*  --  2.8*  --   --   PHOS 3.1  --  4.4  --  4.3  --  4.1  --   --   --  6.8*  --   --    < > = values in this interval not displayed.   GFR: Estimated Creatinine Clearance: 124.5 mL/min (by C-G formula based on SCr of 0.64 mg/dL). Recent Labs  Lab 2019-10-19 0908 2019-10-19 1922 09/24/19 0322 09/26/19 0451 09/27/19 0439 09/28/19 0500 09/29/19 0445  PROCALCITON 5.00  --   --   --   --   --   --   WBC 5.9  --    < > 4.4 5.6 11.8* 12.2*  LATICACIDVEN 2.6* 1.4  --   --   --   --   --    < > = values in this interval not displayed.    Liver Function Tests: Recent Labs  Lab 09/25/19 0428 09/26/19 0451 09/27/19 0439 09/28/19 0500 09/29/19 0445  AST 41 32 35 65* 50*  ALT 34 31 29 49* 59*  ALKPHOS 58 50 46 52 45  BILITOT 0.5 0.5 0.5 0.5 0.4  PROT 6.6 5.8* 5.5* 6.3* 5.5*  ALBUMIN 2.7* 2.5* 2.4* 2.9* 2.7*   No results for input(s): LIPASE, AMYLASE in the last 168 hours. No results for input(s): AMMONIA in the last 168 hours.  ABG    Component Value Date/Time   PHART 7.298 (L) 09/28/2019 1005   PCO2ART 61.9 (H) 09/28/2019 1005   PO2ART 72.0 (L) 09/28/2019 1005   HCO3 30.3 (H) 09/28/2019 1005   TCO2 32 09/28/2019 1005   ACIDBASEDEF 1.0 09/26/2019 1832   O2SAT 92.0 09/28/2019 1005     Coagulation Profile: Recent Labs  Lab 10/19/2019 0908  INR 1.0    Cardiac Enzymes: No results for input(s): CKTOTAL, CKMB, CKMBINDEX, TROPONINI in the last 168 hours.  HbA1C: Hgb A1c MFr Bld  Date/Time Value Ref Range Status  10/19/2019 05:02 PM 6.4 (H) 4.8 - 5.6 % Final    Comment:    (NOTE) Pre diabetes:          5.7%-6.4% Diabetes:              >6.4% Glycemic control for   <7.0% adults with diabetes     CBG: Recent Labs  Lab 09/28/19 1639 09/28/19 1928 09/28/19 2317 09/29/19 0442 09/29/19 0741  GLUCAP 227* 214* 224* 169* 165*    This patient is critically ill with  multiple organ system failure which requires frequent high complexity decision making, assessment, support, evaluation, and titration of therapies. This was completed through the application of advanced monitoring technologies and extensive interpretation of multiple databases. During this encounter critical care time was devoted to patient care services described in this note for 33 minutes.    Collier Bullock, MD 09/29/19 8:07 AM [facility]

## 2019-09-30 ENCOUNTER — Inpatient Hospital Stay (HOSPITAL_COMMUNITY): Payer: No Typology Code available for payment source

## 2019-09-30 LAB — COMPREHENSIVE METABOLIC PANEL
ALT: 60 U/L — ABNORMAL HIGH (ref 0–44)
AST: 40 U/L (ref 15–41)
Albumin: 2.6 g/dL — ABNORMAL LOW (ref 3.5–5.0)
Alkaline Phosphatase: 42 U/L (ref 38–126)
Anion gap: 9 (ref 5–15)
BUN: 21 mg/dL — ABNORMAL HIGH (ref 6–20)
CO2: 30 mmol/L (ref 22–32)
Calcium: 8.7 mg/dL — ABNORMAL LOW (ref 8.9–10.3)
Chloride: 101 mmol/L (ref 98–111)
Creatinine, Ser: 0.5 mg/dL (ref 0.44–1.00)
GFR calc Af Amer: >60 mL/min (ref >60)
GFR calc non Af Amer: >60 mL/min (ref >60)
Glucose, Bld: 161 mg/dL — ABNORMAL HIGH (ref 70–99)
Potassium: 4.3 mmol/L (ref 3.5–5.1)
Sodium: 140 mmol/L (ref 135–145)
Total Bilirubin: 0.6 mg/dL (ref 0.3–1.2)
Total Protein: 5.3 g/dL — ABNORMAL LOW (ref 6.5–8.1)

## 2019-09-30 LAB — GLUCOSE, CAPILLARY
Glucose-Capillary: 139 mg/dL — ABNORMAL HIGH (ref 70–99)
Glucose-Capillary: 150 mg/dL — ABNORMAL HIGH (ref 70–99)
Glucose-Capillary: 153 mg/dL — ABNORMAL HIGH (ref 70–99)
Glucose-Capillary: 162 mg/dL — ABNORMAL HIGH (ref 70–99)
Glucose-Capillary: 207 mg/dL — ABNORMAL HIGH (ref 70–99)
Glucose-Capillary: 219 mg/dL — ABNORMAL HIGH (ref 70–99)

## 2019-09-30 LAB — FERRITIN: Ferritin: 74 ng/mL (ref 11–307)

## 2019-09-30 LAB — D-DIMER, QUANTITATIVE: D-Dimer, Quant: 0.52 ug/mL-FEU — ABNORMAL HIGH (ref 0.00–0.50)

## 2019-09-30 MED ORDER — DEXMEDETOMIDINE HCL IN NACL 400 MCG/100ML IV SOLN
0.4000 ug/kg/h | INTRAVENOUS | Status: DC
  Administered 2019-09-30: 0.9 ug/kg/h via INTRAVENOUS
  Administered 2019-09-30: 0.7 ug/kg/h via INTRAVENOUS
  Administered 2019-09-30: 0.4 ug/kg/h via INTRAVENOUS
  Administered 2019-09-30: 1 ug/kg/h via INTRAVENOUS
  Administered 2019-10-01: 0.5 ug/kg/h via INTRAVENOUS
  Administered 2019-10-01: 0.4 ug/kg/h via INTRAVENOUS
  Administered 2019-10-01: 0.7 ug/kg/h via INTRAVENOUS
  Administered 2019-10-01: 0.3 ug/kg/h via INTRAVENOUS
  Administered 2019-10-01: 0.5 ug/kg/h via INTRAVENOUS
  Administered 2019-10-02: 0.6 ug/kg/h via INTRAVENOUS
  Administered 2019-10-02: 0.9 ug/kg/h via INTRAVENOUS
  Administered 2019-10-02: 0.5 ug/kg/h via INTRAVENOUS
  Administered 2019-10-02: 0.8 ug/kg/h via INTRAVENOUS
  Administered 2019-10-02 (×2): 1 ug/kg/h via INTRAVENOUS
  Administered 2019-10-03: 1.2 ug/kg/h via INTRAVENOUS
  Administered 2019-10-03: 1 ug/kg/h via INTRAVENOUS
  Administered 2019-10-03 (×2): 1.2 ug/kg/h via INTRAVENOUS
  Administered 2019-10-03 (×2): 1 ug/kg/h via INTRAVENOUS
  Administered 2019-10-03: 1.1 ug/kg/h via INTRAVENOUS
  Administered 2019-10-03 – 2019-10-04 (×6): 1.2 ug/kg/h via INTRAVENOUS
  Filled 2019-09-30 (×28): qty 100

## 2019-09-30 MED ORDER — IPRATROPIUM-ALBUTEROL 0.5-2.5 (3) MG/3ML IN SOLN
3.0000 mL | Freq: Four times a day (QID) | RESPIRATORY_TRACT | Status: DC
  Administered 2019-09-30 – 2019-10-03 (×12): 3 mL via RESPIRATORY_TRACT
  Filled 2019-09-30 (×10): qty 3

## 2019-09-30 MED ORDER — HYDRALAZINE HCL 20 MG/ML IJ SOLN
5.0000 mg | INTRAMUSCULAR | Status: DC | PRN
  Administered 2019-09-30: 10 mg via INTRAVENOUS
  Administered 2019-09-30: 5 mg via INTRAVENOUS
  Administered 2019-09-30 – 2019-10-04 (×3): 10 mg via INTRAVENOUS
  Administered 2019-10-07: 5 mg via INTRAVENOUS
  Administered 2019-10-09: 10 mg via INTRAVENOUS
  Administered 2019-10-09: 5 mg via INTRAVENOUS
  Filled 2019-09-30 (×8): qty 1

## 2019-09-30 NOTE — Progress Notes (Signed)
Pt waking up, reaching toward ETT. Does not follow commands. PRN versed given at this time.

## 2019-09-30 NOTE — Progress Notes (Signed)
eLink Physician-Brief Progress Note Patient Name: Summer Guzman DOB: [birth year] MRN: [id number]   Date of Service  09/30/2019  HPI/Events of Note  Pt with mildly elevated blood pressure.  eICU Interventions  Hydralazine 5-10 mg iv Q 4 hours PRN SBP > 150 mmHg.        Summer Guzman 09/30/2019, 2:54 AM

## 2019-09-30 NOTE — Progress Notes (Signed)
Pt adequately sedated, synchronous with the ventilator. SBP >160s, elink notified d/t no PRN for HTN.  Await MD response, will continue to monitor.

## 2019-09-30 NOTE — Progress Notes (Signed)
NAME:  Tawnie Ehresman, MRN:  [id number], DOB:  04-18-1966, LOS: 7 ADMISSION DATE:  10/15/2019, CONSULTATION DATE:  3/22 REFERRING MD: Wynelle Cleveland, CHIEF COMPLAINT:  Dyspnea   Brief History   54 y/o female admitted to [facility] with ARDS due to COVID pneumonia on 3/21.  Intubated 3/22.  History of present illness   This is a pleasant 54 y/o female who tested positive for COVID on 3/17.  She believed she became infected after exposure to sick family.  She presented to [facility] on 3/21 with dyspnea, cough, found to be very hypoxemic.  Received Actemra, remdesivir, decadron.  Her respiratory rate continued to climb after admission and her O2 saturation dropped.  She complained of feeling more dyspneic.  After discussion with the patient it was decided to place her on mechanical ventilation. Developed atrial fib with RVR.   Past Medical History  asthma  Significant Hospital Events   3/21 admission 3/22 intubation, transfer from [facility] to [facility]  3/27 hypoxia improving  Consults:  PCCM  Procedures:  3/22 ETT >  3/22 L IJ CVL >  3/22 radial A-line>  Significant Diagnostic Tests:  3/21 TTE> LVEF 70-75%, RV function normal, valves OK  Micro Data:  3/17 SARS COV 2 > positive outside [place] 3/21 blood >  3/22 sputum> NG   Antimicrobials/COVID Rx:  3/21 cefepime x1 3/21 tocilizumab x1 3/21 remdesivir >  3/21 decadron >   Interim history/subjective:  Still intermittently dyssynchronous   Objective   Blood pressure 111/70, pulse 80, temperature 98 F (36.7 C), temperature source Axillary, resp. rate 17, height 5\' 6"  (1.676 m), weight (!) 148.6 kg, last menstrual period 09/21/2019, SpO2 (!) 88 %.    Vent Mode: PRVC FiO2 (%):  [40 %] 40 % Set Rate:  [22 bmp] 22 bmp Vt Set:  [470 mL] 470 mL PEEP:  [5 cmH20-8 cmH20] 5 cmH20 Pressure Support:  [10 cmH20] 10 cmH20 Plateau Pressure:  [20 cmH20-23 cmH20] 20 cmH20   Intake/Output Summary (Last 24 hours) at 09/30/2019 1102 Last data filed at 09/30/2019  0700 Gross per 24 hour  Intake 4910.81 ml  Output 1830 ml  Net 3080.81 ml   Filed Weights   2019-10-15 1600 09/26/19 0500 09/30/19 0415  Weight: (!) 138.7 kg (!) 150.8 kg (!) 148.6 kg    Examination:  General: NAD, intubated, opens eyes to voice (on propofol and fentanyl).   HENT: [place]/AT eyes anicteric PULM: Clear to auscultation bilaterally, decreased basilar breath sounds. No current dyssynchrony CV: Normal sinus rhythm today GI:, Soft, nontender, nondistended Extremities: no edema, erythema, no clubbing or cyanosis Neuro: opens eyes to voice, not following commands, withdraws to pain  On PSV - bradypneic, low tidal volumens Resolved Hospital Problem list    Assessment & Plan:  Severe ARDS due to COVID 19 pneumonia: -Continue low tidal volume ventilation, 4 to 8 cc/kg IBW with goal driving pressure less than 15 and plateau pressure less than 30. -daily ABG  -No longer requiring proning -trial off fentanyl today, trial of PSV.   -Conservative fluid strategy.  Receiving FW flushes currently, I/O even.  -VAP prevention protocol -Continue Decadron per protocol. (day 8 today) -Previously received tocilizumab, and remdesivir x 5 days lovenox 1mg /kg BID (atrial fib)  Need for sedation for mechanical ventilation -Target RASS 0 -1 now that improved vent mechanics -Continue fentanyl and propofol per pad protocol  Nutrition: -Continue tube feeds and free water flushes (decrease to 200 q6) -Continue monitoring electrolytes  Atrial fib with RVR, new onset.  Back in NSR. Slight sinus brady, intermittent tachycardia.  -Continue telemetry monitoring -holding dilt.  -Continue Lovenox twice daily  HTN: Monitor, intermittent, diltiazem. hydral prn started overnight.   Hyperglycemia Possible undx DM.  Currently on decadron.   -Continue Lantus- 20 daily    -Accu-Cheks every 4 hours with sliding scale insulin as needed. Adding 2 units Q4h TF coverage. -Goal blood glucose 140-180 while  admitted to the ICU  Acute anemia likely due to critical illness -Transfuse for hemoglobin less than 7 -Continue to monitor  Leukocytosis afebrile -UA nl  Cont to monitor for signs of infection.  -Trach aspirate  Elevated transaminase level- possibly due to course of remdesivir -con't to monitor   Hx of Asthma - albuterol prn prior to admission Cont nebs  Best practice:  Diet: tube feeding  Pain/Anxiety/Delirium protocol (if indicated): ordered  VAP protocol (if indicated): yes DVT prophylaxis: lovenox GI prophylaxis: PPI   Glucose control: SSI Mobility: bed rest Code Status: full Family Communication: sister Hector Shade to be called today.  Disposition: ICU  Labs   CBC: Recent Labs  Lab 09/25/19 0428 09/25/19 1458 09/26/19 0451 09/26/19 1832 09/27/19 0439 09/27/19 2341 09/28/19 0500 09/28/19 1005 09/29/19 0445 09/29/19 1117 09/29/19 2346  WBC 3.7*  --  4.4  --  5.6  --  11.8*  --  12.2*  --   --   NEUTROABS 2.4  --  3.2  --  4.0  --  8.8*  --  9.0*  --   --   HGB 12.3   < > 11.7*   < > 11.2*   < > 12.0 13.6 11.1* 13.3 11.9*  HCT 40.7   < > 38.4   < > 37.4   < > 40.6 40.0 36.5 39.0 35.0*  MCV 68.6*  --  69.2*  --  69.6*  --  71.0*  --  70.1*  --   --   PLT 290  --  299  --  307  --  382  --  322  --   --    < > = values in this interval not displayed.    Basic Metabolic Panel: Recent Labs  Lab 09/24/19 1700 09/25/19 0006 09/25/19 0428 09/25/19 1458 09/26/19 0451 09/26/19 1832 09/27/19 0439 09/27/19 0439 09/27/19 2130 09/27/19 2341 09/28/19 0500 09/28/19 0500 09/28/19 1005 09/29/19 0445 09/29/19 1117 09/29/19 2346 09/30/19 0300  NA  --    < > 139   < > 137   < > 140   < > 139   < > 141   < > 139 139 140 137 140  K  --    < > 4.3   < > 4.2   < > 4.4   < > 4.5   < > 4.7   < > 4.4 4.2 3.9 4.4 4.3  CL  --   --  107  --  104  --  103  --  103  --  100  --   --  102  --   --  101  CO2  --   --  20*  --  22  --  24  --  27  --  26  --   --  28  --   --   30  GLUCOSE  --   --  200*  --  201*  --  191*  --  216*  --  179*  --   --  204*  --   --  161*  BUN  --   --  18  --  18  --  26*  --  37*  --  48*  --   --  29*  --   --  21*  CREATININE  --   --  0.80  --  0.62  --  0.76  --  0.99  --  1.20*  --   --  0.64  --   --  0.50  CALCIUM  --   --  8.8*  --  8.7*  --  8.6*  --  8.8*  --  8.9  --   --  8.5*  --   --  8.7*  MG 2.6*  --  2.6*  --  2.6*  --  2.5*  --  2.6*  --  2.8*  --   --   --   --   --   --   PHOS 3.1  --  4.4  --  4.3  --  4.1  --   --   --  6.8*  --   --   --   --   --   --    < > = values in this interval not displayed.   GFR: Estimated Creatinine Clearance: 123.4 mL/min (by C-G formula based on SCr of 0.5 mg/dL). Recent Labs  Lab 09/16/2019 1922 09/24/19 0322 09/26/19 0451 09/27/19 0439 09/28/19 0500 09/29/19 0445  WBC  --    < > 4.4 5.6 11.8* 12.2*  LATICACIDVEN 1.4  --   --   --   --   --    < > = values in this interval not displayed.    Liver Function Tests: Recent Labs  Lab 09/26/19 0451 09/27/19 0439 09/28/19 0500 09/29/19 0445 09/30/19 0300  AST 32 35 65* 50* 40  ALT 31 29 49* 59* 60*  ALKPHOS 50 46 52 45 42  BILITOT 0.5 0.5 0.5 0.4 0.6  PROT 5.8* 5.5* 6.3* 5.5* 5.3*  ALBUMIN 2.5* 2.4* 2.9* 2.7* 2.6*   No results for input(s): LIPASE, AMYLASE in the last 168 hours. No results for input(s): AMMONIA in the last 168 hours.  ABG    Component Value Date/Time   PHART 7.438 09/29/2019 2346   PCO2ART 47.1 09/29/2019 2346   PO2ART 80.0 (L) 09/29/2019 2346   HCO3 32.0 (H) 09/29/2019 2346   TCO2 33 (H) 09/29/2019 2346   ACIDBASEDEF 1.0 09/26/2019 1832   O2SAT 96.0 09/29/2019 2346     Coagulation Profile: No results for input(s): INR, PROTIME in the last 168 hours.  Cardiac Enzymes: No results for input(s): CKTOTAL, CKMB, CKMBINDEX, TROPONINI in the last 168 hours.  HbA1C: Hgb A1c MFr Bld  Date/Time Value Ref Range Status  09/18/2019 05:02 PM 6.4 (H) 4.8 - 5.6 % Final    Comment:    (NOTE)  Pre diabetes:          5.7%-6.4% Diabetes:              >6.4% Glycemic control for   <7.0% adults with diabetes     CBG: Recent Labs  Lab 09/29/19 1546 09/29/19 1944 09/29/19 2322 09/30/19 0357 09/30/19 0825  GLUCAP 201* 206* 189* 153* 139*    This patient is critically ill with multiple organ system failure which requires frequent high complexity decision making, assessment, support, evaluation, and titration of therapies. This was completed through the application of advanced monitoring technologies and extensive interpretation of multiple databases. During this encounter critical  care time was devoted to patient care services described in this note for 33 minutes.    Charlotte Sanes, MD 09/30/19 11:02 AM [facility]

## 2019-10-01 LAB — COMPREHENSIVE METABOLIC PANEL
ALT: 70 U/L — ABNORMAL HIGH (ref 0–44)
AST: 51 U/L — ABNORMAL HIGH (ref 15–41)
Albumin: 2.7 g/dL — ABNORMAL LOW (ref 3.5–5.0)
Alkaline Phosphatase: 47 U/L (ref 38–126)
Anion gap: 10 (ref 5–15)
BUN: 21 mg/dL — ABNORMAL HIGH (ref 6–20)
CO2: 30 mmol/L (ref 22–32)
Calcium: 8.5 mg/dL — ABNORMAL LOW (ref 8.9–10.3)
Chloride: 102 mmol/L (ref 98–111)
Creatinine, Ser: 0.44 mg/dL (ref 0.44–1.00)
GFR calc Af Amer: >60 mL/min (ref >60)
GFR calc non Af Amer: >60 mL/min (ref >60)
Glucose, Bld: 126 mg/dL — ABNORMAL HIGH (ref 70–99)
Potassium: 4.7 mmol/L (ref 3.5–5.1)
Sodium: 142 mmol/L (ref 135–145)
Total Bilirubin: 0.5 mg/dL (ref 0.3–1.2)
Total Protein: 5.6 g/dL — ABNORMAL LOW (ref 6.5–8.1)

## 2019-10-01 LAB — CBC WITH DIFFERENTIAL/PLATELET
Abs Immature Granulocytes: 2.15 10*3/uL — ABNORMAL HIGH (ref 0.00–0.07)
Basophils Absolute: 0.1 10*3/uL (ref 0.0–0.1)
Basophils Relative: 1 %
Eosinophils Absolute: 0 10*3/uL (ref 0.0–0.5)
Eosinophils Relative: 0 %
HCT: 38.2 % (ref 36.0–46.0)
Hemoglobin: 11.6 g/dL — ABNORMAL LOW (ref 12.0–15.0)
Immature Granulocytes: 16 %
Lymphocytes Relative: 11 %
Lymphs Abs: 1.5 10*3/uL (ref 0.7–4.0)
MCH: 21.1 pg — ABNORMAL LOW (ref 26.0–34.0)
MCHC: 30.4 g/dL (ref 30.0–36.0)
MCV: 69.5 fL — ABNORMAL LOW (ref 80.0–100.0)
Monocytes Absolute: 1.2 10*3/uL — ABNORMAL HIGH (ref 0.1–1.0)
Monocytes Relative: 8 %
Neutro Abs: 8.9 10*3/uL — ABNORMAL HIGH (ref 1.7–7.7)
Neutrophils Relative %: 64 %
Platelets: 341 10*3/uL (ref 150–400)
RBC: 5.5 MIL/uL — ABNORMAL HIGH (ref 3.87–5.11)
RDW: 18.4 % — ABNORMAL HIGH (ref 11.5–15.5)
WBC: 13.9 10*3/uL — ABNORMAL HIGH (ref 4.0–10.5)
nRBC: 0.4 % — ABNORMAL HIGH (ref 0.0–0.2)

## 2019-10-01 LAB — POCT I-STAT 7, (LYTES, BLD GAS, ICA,H+H)
Acid-Base Excess: 6 mmol/L — ABNORMAL HIGH (ref 0.0–2.0)
Bicarbonate: 30.9 mmol/L — ABNORMAL HIGH (ref 20.0–28.0)
Calcium, Ion: 1.22 mmol/L (ref 1.15–1.40)
HCT: 40 % (ref 36.0–46.0)
Hemoglobin: 13.6 g/dL (ref 12.0–15.0)
O2 Saturation: 96 %
Patient temperature: 98.5
Potassium: 4.4 mmol/L (ref 3.5–5.1)
Sodium: 141 mmol/L (ref 135–145)
TCO2: 32 mmol/L (ref 22–32)
pCO2 arterial: 44.1 mmHg (ref 32.0–48.0)
pH, Arterial: 7.452 — ABNORMAL HIGH (ref 7.350–7.450)
pO2, Arterial: 81 mmHg — ABNORMAL LOW (ref 83.0–108.0)

## 2019-10-01 LAB — GLUCOSE, CAPILLARY
Glucose-Capillary: 122 mg/dL — ABNORMAL HIGH (ref 70–99)
Glucose-Capillary: 113 mg/dL — ABNORMAL HIGH (ref 70–99)
Glucose-Capillary: 139 mg/dL — ABNORMAL HIGH (ref 70–99)
Glucose-Capillary: 168 mg/dL — ABNORMAL HIGH (ref 70–99)
Glucose-Capillary: 148 mg/dL — ABNORMAL HIGH (ref 70–99)
Glucose-Capillary: 117 mg/dL — ABNORMAL HIGH (ref 70–99)

## 2019-10-01 LAB — CULTURE, RESPIRATORY W GRAM STAIN: Culture: NORMAL

## 2019-10-01 LAB — TRIGLYCERIDES: Triglycerides: 301 mg/dL — ABNORMAL HIGH (ref <150)

## 2019-10-01 MED ORDER — SODIUM CHLORIDE 0.9 % IV BOLUS
500.0000 mL | Freq: Once | INTRAVENOUS | Status: AC
  Administered 2019-10-01: 500 mL via INTRAVENOUS

## 2019-10-01 MED ORDER — FREE WATER
200.0000 mL | Status: DC
  Administered 2019-10-01 – 2019-10-02 (×4): 200 mL

## 2019-10-01 MED ORDER — VITAL AF 1.2 CAL PO LIQD
1000.0000 mL | ORAL | Status: DC
  Administered 2019-10-01: 1000 mL

## 2019-10-01 NOTE — Progress Notes (Signed)
Patient was on pressure support throughout the shift. Her vent settings was flipped back to full support around 1830 since she started becoming tachypneic and tachycardic.  She was following commands and able to make eye contact when RN assess her.

## 2019-10-01 NOTE — Progress Notes (Signed)
Sister, Hector Shade, was updated on plan of care and all questions were answered. She was also able to do a video call with the patient.

## 2019-10-01 NOTE — Progress Notes (Signed)
eLink Physician-Brief Progress Note Patient Name: Summer Guzman DOB: 1966-03-29 MRN: [id number]   Date of Service  10/01/2019  HPI/Events of Note  AM labs requested.  eICU Interventions  CMP, CBC in AM ordered. Covid. PNA. Labs reviewed from yesterday.      Intervention Category Minor Interventions: Routine modifications to care plan (e.g. PRN medications for pain, fever)  Ranee Gosselin 10/01/2019, 3:58 AM

## 2019-10-01 NOTE — Progress Notes (Signed)
Nutrition Follow-up  DOCUMENTATION CODES:   Morbid obesity  INTERVENTION:   Tube Feeding:  Change to Vital AF 1.2 at 50 ml/hr Pro-Stat 30 mL TID Provides 1740 kcals, 135 g of protein and 972 mL of free water  TF regimen and propofol at current rate providing 2595 total kcal/day   NUTRITION DIAGNOSIS:   Inadequate oral intake related to inability to eat as evidenced by NPO status.  Being addressed via TF   GOAL:   Provide needs based on ASPEN/SCCM guidelines  Progressing  MONITOR:   Labs, I & O's, Vent status, TF tolerance, Weight trends, Diet advancement  REASON FOR ASSESSMENT:   Consult, Malnutrition Screening Tool, Ventilator Enteral/tube feeding initiation and management  ASSESSMENT:   54 year old female with past medical history of asthma who recently moved here from [place] presented to ED with worsening SOB over the past week and reports cough, nausea, vomiting, diarrhea, weakness and body aches and admitted on 3/21 for acute respiratory failure due to COVID-19 pneumonia.  3/21 Admit 3/22 Intubated  No longer requiring proning Patient is currently intubated on ventilator support, sedated with fentanyl, precedex and propofol drips MV: 8.2 L/min Temp (24hrs), Avg:98.2 F (36.8 C), Min:97.5 F (36.4 C), Max:98.9 F (37.2 C)  Propofol: 32.4 ml/hr  Vital High Protein at 40 ml/hr Free water 200 mL q 6 hours  Current wt 150.1 kg; admission weight 138.7 kg  No skin breakdown per RN assessment since last nutrition assessment   Labs: CBGs 113-219 Meds: Vit C, decadron, ss novolog, novolog q 4 hours, lantus, MVI with Minerals   Diet Order:   Diet Order            Diet NPO time specified  Diet effective now              EDUCATION NEEDS:   Not appropriate for education at this time  Skin:  Skin Assessment: Reviewed RN Assessment  Last BM:  3/25  Height:   Ht Readings from Last 1 Encounters:  September 28, 2019 5\' 6"  (1.676 m)    Weight:   Wt  Readings from Last 1 Encounters:  10/01/19 (!) 150.1 kg    Ideal Body Weight:  (IBW 59 kg, Ajdusted BW 91 kg)  BMI:  Body mass index is 53.41 kg/m.  Estimated Nutritional Needs:   Kcal:  10/03/19 kcals  Protein:  120-148  Fluid:  >/= 2 L/day    [id number] MS, RDN, LDN, CNSC RD Pager Number and Weekend/On-Call After Hours Pager Located in [place]

## 2019-10-01 NOTE — Progress Notes (Signed)
Assisted tele visit to patient with family member.  Quadre Bristol M, RN  

## 2019-10-01 NOTE — Progress Notes (Signed)
NAME:  Zelpha Messing, MRN:  [id number], DOB:  22-Jul-1965, LOS: 8 ADMISSION DATE:  09/11/2019, CONSULTATION DATE:  3/22 REFERRING MD: Butler Denmark, CHIEF COMPLAINT:  Dyspnea   Brief History   54 y/o female admitted to [facility] with ARDS due to COVID pneumonia on 3/21.  Intubated 3/22.  History of present illness   This is a pleasant 54 y/o female who tested positive for COVID on 3/17.  She believed she became infected after exposure to sick family.  She presented to [facility] on 3/21 with dyspnea, cough, found to be very hypoxemic.  Received Actemra, remdesivir, decadron.  Her respiratory rate continued to climb after admission and her O2 saturation dropped.  She complained of feeling more dyspneic.  After discussion with the patient it was decided to place her on mechanical ventilation. Developed atrial fib with RVR.   Past Medical History  asthma  Significant Hospital Events   3/21 admission 3/22 intubation, transfer from [facility] to [facility]  3/27 hypoxia improving  Consults:  PCCM  Procedures:  3/22 ETT >  3/22 L IJ CVL >  3/22 radial A-line>  Significant Diagnostic Tests:  3/21 TTE> LVEF 70-75%, RV function normal, valves OK  Micro Data:  3/17 SARS COV 2 > positive outside [place] 3/21 blood >  3/22 sputum> NG   Antimicrobials/COVID Rx:  3/21 cefepime x1 3/21 tocilizumab x1 3/21 remdesivir >  3/21 decadron >   Interim history/subjective:  Weaning down on fentanyl, tolerating PSV   Objective   Blood pressure 106/67, pulse 75, temperature (!) 97.5 F (36.4 C), temperature source Axillary, resp. rate (!) 22, height 5\' 6"  (1.676 m), weight (!) 150.1 kg, last menstrual period 09/21/2019, SpO2 92 %.    Vent Mode: PRVC FiO2 (%):  [40 %] 40 % Set Rate:  [22 bmp] 22 bmp Vt Set:  [470 mL] 470 mL PEEP:  [5 cmH20] 5 cmH20 Plateau Pressure:  [19 cmH20-22 cmH20] 21 cmH20   Intake/Output Summary (Last 24 hours) at 10/01/2019 0958 Last data filed at 10/01/2019 0900 Gross per 24 hour  Intake  3175.84 ml  Output 2480 ml  Net 695.84 ml   Filed Weights   09/26/19 0500 09/30/19 0415 10/01/19 0428  Weight: (!) 150.8 kg (!) 148.6 kg (!) 150.1 kg    Examination:  General: NAD, intubated, opens eyes to voice (on propofol and dex) HENT: [place]/AT eyes anicteric PULM: Clear to auscultation bilaterally, decreased basilar breath sounds. No current dyssynchrony CV: Normal sinus rhythm today GI:, Soft, nontender, nondistended Extremities: no edema, erythema, no clubbing or cyanosis Neuro: opens eyes to voice, not following commands, withdraws to pain  On PSV - bradypneic, low tidal volumens Resolved Hospital Problem list    Assessment & Plan:  Severe ARDS due to COVID 19 pneumonia: -weaning vent, PSV, still intermittently hypoxic to high 80s, pao2 80 today, improving.    -No longer requiring proning -cont to keep her off fentanyl, only pushes if needed.  -try to wean off propofol as tolerates.  -Conservative fluid strategy.  Receiving FW flushes currently, I/O even.  -VAP prevention protocol -Continue Decadron per protocol. (day 9 today) -Previously received tocilizumab, and remdesivir x 5 days lovenox 1mg /kg BID (atrial fib)  Nutrition: -Continue tube feeds and free water flushes (decrease to 200 q6) -Continue monitoring electrolytes  Atrial fib with RVR, new onset. Back in NSR for past 3 days. Slight sinus brady, intermittent tachycardia.  -Continue telemetry monitoring -holding dilt.  -Continue Lovenox twice daily for now  HTN: Monitor, intermittent.  hydral prn.    Hyperglycemia Possible undx DM.  Currently on decadron.   -Continue Lantus- 20 daily    -Accu-Cheks every 4 hours with sliding scale insulin as needed. Adding 2 units Q4h TF coverage. -Goal blood glucose 140-180 while admitted to the ICU  Acute anemia likely due to critical illness -Transfuse for hemoglobin less than 7 -Continue to monitor  Leukocytosis afebrile -UA nl  Cont to monitor for signs of  infection.  -Trach aspirate  Elevated transaminase level- possibly due to course of remdesivir -con't to monitor   Hx of Asthma - albuterol prn prior to admission Cont nebs  Best practice:  Diet: tube feeding  Pain/Anxiety/Delirium protocol (if indicated): ordered  VAP protocol (if indicated): yes DVT prophylaxis: lovenox GI prophylaxis: PPI   Glucose control: SSI Mobility: bed rest Code Status: full Family Communication: spoke to sister and other family over video chat in the room  Disposition: ICU  Labs   CBC: Recent Labs  Lab 09/26/19 0451 09/26/19 1832 09/27/19 0439 09/27/19 2341 09/28/19 0500 09/28/19 1005 09/29/19 0445 09/29/19 1117 09/29/19 2346 10/01/19 0051 10/01/19 0411  WBC 4.4  --  5.6  --  11.8*  --  12.2*  --   --   --  13.9*  NEUTROABS 3.2  --  4.0  --  8.8*  --  9.0*  --   --   --  8.9*  HGB 11.7*   < > 11.2*   < > 12.0   < > 11.1* 13.3 11.9* 13.6 11.6*  HCT 38.4   < > 37.4   < > 40.6   < > 36.5 39.0 35.0* 40.0 38.2  MCV 69.2*  --  69.6*  --  71.0*  --  70.1*  --   --   --  69.5*  PLT 299  --  307  --  382  --  322  --   --   --  341   < > = values in this interval not displayed.    Basic Metabolic Panel: Recent Labs  Lab 09/24/19 1700 09/25/19 0006 09/25/19 0428 09/25/19 1458 09/26/19 0451 09/26/19 1832 09/27/19 0439 09/27/19 0439 09/27/19 2130 09/27/19 2341 09/28/19 0500 09/28/19 1005 09/29/19 0445 09/29/19 0445 09/29/19 1117 09/29/19 2346 09/30/19 0300 10/01/19 0051 10/01/19 0411  NA  --    < > 139   < > 137   < > 140   < > 139   < > 141   < > 139   < > 140 137 140 141 142  K  --    < > 4.3   < > 4.2   < > 4.4   < > 4.5   < > 4.7   < > 4.2   < > 3.9 4.4 4.3 4.4 4.7  CL  --    < > 107  --  104  --  103   < > 103  --  100  --  102  --   --   --  101  --  102  CO2  --    < > 20*  --  22  --  24   < > 27  --  26  --  28  --   --   --  30  --  30  GLUCOSE  --    < > 200*  --  201*  --  191*   < > 216*  --  179*  --  204*  --   --    --  161*  --  126*  BUN  --    < > 18  --  18  --  26*   < > 37*  --  48*  --  29*  --   --   --  21*  --  21*  CREATININE  --    < > 0.80  --  0.62  --  0.76   < > 0.99  --  1.20*  --  0.64  --   --   --  0.50  --  0.44  CALCIUM  --    < > 8.8*  --  8.7*  --  8.6*   < > 8.8*  --  8.9  --  8.5*  --   --   --  8.7*  --  8.5*  MG 2.6*  --  2.6*  --  2.6*  --  2.5*  --  2.6*  --  2.8*  --   --   --   --   --   --   --   --   PHOS 3.1  --  4.4  --  4.3  --  4.1  --   --   --  6.8*  --   --   --   --   --   --   --   --    < > = values in this interval not displayed.   GFR: Estimated Creatinine Clearance: 124.1 mL/min (by C-G formula based on SCr of 0.44 mg/dL). Recent Labs  Lab 09/27/19 0439 09/28/19 0500 09/29/19 0445 10/01/19 0411  WBC 5.6 11.8* 12.2* 13.9*    Liver Function Tests: Recent Labs  Lab 09/27/19 0439 09/28/19 0500 09/29/19 0445 09/30/19 0300 10/01/19 0411  AST 35 65* 50* 40 51*  ALT 29 49* 59* 60* 70*  ALKPHOS 46 52 45 42 47  BILITOT 0.5 0.5 0.4 0.6 0.5  PROT 5.5* 6.3* 5.5* 5.3* 5.6*  ALBUMIN 2.4* 2.9* 2.7* 2.6* 2.7*   No results for input(s): LIPASE, AMYLASE in the last 168 hours. No results for input(s): AMMONIA in the last 168 hours.  ABG    Component Value Date/Time   PHART 7.452 (H) 10/01/2019 0051   PCO2ART 44.1 10/01/2019 0051   PO2ART 81.0 (L) 10/01/2019 0051   HCO3 30.9 (H) 10/01/2019 0051   TCO2 32 10/01/2019 0051   ACIDBASEDEF 1.0 09/26/2019 1832   O2SAT 96.0 10/01/2019 0051     Coagulation Profile: No results for input(s): INR, PROTIME in the last 168 hours.  Cardiac Enzymes: No results for input(s): CKTOTAL, CKMB, CKMBINDEX, TROPONINI in the last 168 hours.  HbA1C: Hgb A1c MFr Bld  Date/Time Value Ref Range Status  2019/10/11 05:02 PM 6.4 (H) 4.8 - 5.6 % Final    Comment:    (NOTE) Pre diabetes:          5.7%-6.4% Diabetes:              >6.4% Glycemic control for   <7.0% adults with diabetes     CBG: Recent Labs  Lab  09/30/19 1624 09/30/19 1954 09/30/19 2337 10/01/19 0411 10/01/19 0722  GLUCAP 219* 207* 162* 122* 113*    This patient is critically ill with multiple organ system failure which requires frequent high complexity decision making, assessment, support, evaluation, and titration of therapies. This was completed through the application of advanced monitoring technologies and extensive interpretation of multiple  databases. During this encounter critical care time was devoted to patient care services described in this note for 33 minutes.    Charlotte Sanes, MD 10/01/19 9:58 AM [facility]

## 2019-10-01 NOTE — Progress Notes (Signed)
Dr. Gonzales made aware that patient's SBP is >180. RN gave her hydralazine IV at 1630. Given an order for a one time dose of 10 mg hydralazine ahead of schedule.  

## 2019-10-02 ENCOUNTER — Inpatient Hospital Stay (HOSPITAL_COMMUNITY): Payer: No Typology Code available for payment source

## 2019-10-02 LAB — POCT I-STAT 7, (LYTES, BLD GAS, ICA,H+H)
Acid-Base Excess: 5 mmol/L — ABNORMAL HIGH (ref 0.0–2.0)
Bicarbonate: 29.4 mmol/L — ABNORMAL HIGH (ref 20.0–28.0)
Calcium, Ion: 1.23 mmol/L (ref 1.15–1.40)
HCT: 42 % (ref 36.0–46.0)
Hemoglobin: 14.3 g/dL (ref 12.0–15.0)
O2 Saturation: 96 %
Patient temperature: 99
Potassium: 4.1 mmol/L (ref 3.5–5.1)
Sodium: 138 mmol/L (ref 135–145)
TCO2: 31 mmol/L (ref 22–32)
pCO2 arterial: 41 mmHg (ref 32.0–48.0)
pH, Arterial: 7.464 — ABNORMAL HIGH (ref 7.350–7.450)
pO2, Arterial: 75 mmHg — ABNORMAL LOW (ref 83.0–108.0)

## 2019-10-02 LAB — GLUCOSE, CAPILLARY
Glucose-Capillary: 112 mg/dL — ABNORMAL HIGH (ref 70–99)
Glucose-Capillary: 107 mg/dL — ABNORMAL HIGH (ref 70–99)
Glucose-Capillary: 129 mg/dL — ABNORMAL HIGH (ref 70–99)
Glucose-Capillary: 163 mg/dL — ABNORMAL HIGH (ref 70–99)
Glucose-Capillary: 135 mg/dL — ABNORMAL HIGH (ref 70–99)
Glucose-Capillary: 102 mg/dL — ABNORMAL HIGH (ref 70–99)

## 2019-10-02 LAB — HEPARIN ANTI-XA: Heparin LMW: >2 IU/mL

## 2019-10-02 MED ORDER — ENOXAPARIN SODIUM 100 MG/ML ~~LOC~~ SOLN
100.0000 mg | Freq: Two times a day (BID) | SUBCUTANEOUS | Status: DC
  Filled 2019-10-02: qty 1

## 2019-10-02 MED ORDER — INSULIN GLARGINE 100 UNIT/ML ~~LOC~~ SOLN
10.0000 [IU] | Freq: Every day | SUBCUTANEOUS | Status: DC
  Administered 2019-10-02 – 2019-10-04 (×3): 10 [IU] via SUBCUTANEOUS
  Filled 2019-10-02 (×4): qty 0.1

## 2019-10-02 MED ORDER — FREE WATER: 200.0000 mL | Freq: Four times a day (QID) | Status: DC

## 2019-10-02 MED ORDER — FREE WATER
100.0000 mL | Freq: Four times a day (QID) | Status: DC
  Administered 2019-10-02 – 2019-10-03 (×4): 100 mL

## 2019-10-02 NOTE — Progress Notes (Signed)
Patient moved from room 3M06 to 3M10 while on ventilator with no problems.

## 2019-10-02 NOTE — Progress Notes (Addendum)
Personal belongings found at bedside: IPad, 2 smart phones, flip phone, silver earrings, black wallet (3 $1 bills), coins (5 pennies, 2 nickels, 2 dimes, 4 quarters), phone charger, and clothing.   Patient valuables envelope filled out. Patient belongings given to security in the Emergency Department. Patient valuables form is in her chart at the [facility] secretary's desk.

## 2019-10-02 NOTE — Progress Notes (Signed)
Patient's sister, Weyman Rodney, was updated this morning and again this afternoon about the plan of care for the patient. All questions were answered at this time.  E-link attempted to set up a video chat this afternoon, however, they are having software issues and are still working on fixing the problem at this time.

## 2019-10-02 NOTE — Progress Notes (Signed)
Assisted tele visit to patient with family members.  Marianne Golightly M Dequavious Harshberger, RN   

## 2019-10-02 NOTE — Progress Notes (Signed)
Updated sister via phone

## 2019-10-02 NOTE — Progress Notes (Signed)
NAME:  Summer Guzman, MRN:  [id number], DOB:  1965-12-26, LOS: 9 ADMISSION DATE:  09/26/2019, CONSULTATION DATE:  3/22 REFERRING MD: Butler Denmark, CHIEF COMPLAINT:  Dyspnea   Brief History   54 y/o female admitted to [facility] with ARDS due to COVID pneumonia on 3/21.  Intubated 3/22.  History of present illness   This is a pleasant 54 y/o female who tested positive for COVID on 3/17.  She believed she became infected after exposure to sick family.  She presented to [facility] on 3/21 with dyspnea, cough, found to be very hypoxemic.  Received Actemra, remdesivir, decadron.  Her respiratory rate continued to climb after admission and her O2 saturation dropped.  She complained of feeling more dyspneic.  After discussion with the patient it was decided to place her on mechanical ventilation. Developed atrial fib with RVR.   Past Medical History  asthma  Significant Hospital Events   3/21 admission 3/22 intubation, transfer from [facility] to [facility]  3/27 hypoxia improving  Consults:  PCCM  Procedures:  3/22 ETT >  3/22 L IJ CVL >  3/22 radial A-line> removed  Significant Diagnostic Tests:  3/21 TTE> LVEF 70-75%, RV function normal, valves OK  Micro Data:  3/17 SARS COV 2 > positive outside [place] 3/21 blood > neg 3/22 sputum> NG  3/27 resp: ngtd  Antimicrobials/COVID Rx:  3/21 cefepime x1 3/21 tocilizumab x1 3/21 remdesivir > 3/25 3/21 decadron > 3/30  Interim history/subjective:  3/30: on cpap, tolerated til evening when she had increased wob yesterday and dysynchrony overnight. Pt increased on propofol as well overnight and thus drowsy this am. Will attempt to wean off propofol today in favor of precedex and prn sedation to optimize pt for sbt's and extubation in next day or so.  3/29:Weaning down on fentanyl, tolerating PSV   Objective   Blood pressure 106/61, pulse 75, temperature 98.2 F (36.8 C), temperature source Oral, resp. rate (!) 22, height 5\' 6"  (1.676 m), weight (!) 150.7 kg, last  menstrual period 09/21/2019, SpO2 94 %.    Vent Mode: CPAP;PSV FiO2 (%):  [0.9 %-40 %] 40 % Set Rate:  [22 bmp] 22 bmp Vt Set:  [470 mL] 470 mL PEEP:  [5 cmH20] 5 cmH20 Pressure Support:  [8 cmH20-10 cmH20] 10 cmH20 Plateau Pressure:  [20 cmH20] 20 cmH20   Intake/Output Summary (Last 24 hours) at 10/02/2019 10/04/2019 Last data filed at 10/02/2019 0800 Gross per 24 hour  Intake 2169.43 ml  Output 2200 ml  Net -30.57 ml   Filed Weights   09/30/19 0415 10/01/19 0428 10/02/19 0500  Weight: (!) 148.6 kg (!) 150.1 kg (!) 150.7 kg    Examination:  General: NAD, intubated, nods intermittently to voice (on propofol and dex) HENT: [place]/AT mmmp PULM: + rhonchi bilaterally, No current dyssynchrony CV: Normal sinus rhythm today GI:, Soft, nontender, nondistended Extremities: no edema, erythema, no clubbing or cyanosis Neuro: opens eyes to voice, not following commands, withdraws to pain  On PSV - bradypneic, low tidal volumes Resolved Hospital Problem list    Assessment & Plan:  Severe ARDS due to COVID 19 pneumonia: -weaning vent, PSV, still intermittently hypoxic to high 80s, pao2 75 today -No longer requiring proning -cont to keep her off fentanyl, only pushes if needed.  -try to wean off propofol as tolerates. In favor of precedex and prn sedation -Conservative fluid strategy.  -VAP prevention protocol -Continue Decadron per protocol. (day 10 today) -Previously received tocilizumab, and remdesivir x 5 days lovenox 1mg /kg BID (  atrial fib) -cxr stable with b/l infiltrates, personally reviewed by me.   Nutrition: -Continue tube feeds and free water flushes (decrease to 100q6) -Continue monitoring electrolytes  Atrial fib with RVR, new onset. Back in NSR Slight sinus brady, intermittent tachycardia.  -Continue telemetry monitoring -holding dilt.  -Continue Lovenox twice daily for now  HTN: Monitor, intermittent. hydral prn.    Hyperglycemia with prediabetes -a1c 6.4 -currently  on steroids -Continue Lantus but decrease dose today   -Accu-Cheks every 4 hours with sliding scale insulin as needed. Adding 2 units Q4h TF coverage. -Goal blood glucose 140-180 while admitted to the ICU  Acute anemia likely due to critical illness -Transfuse for hemoglobin less than 7 -Continue to monitor  Leukocytosis afebrile -UA nl  -Cont to monitor for signs of infection.  -Trach aspirate  Elevated transaminase level- possibly due to course of remdesivir -con't to monitor   Hx of Asthma - albuterol prn prior to admission Cont nebs  Best practice:  Diet: tube feeding  Pain/Anxiety/Delirium protocol (if indicated): ordered  VAP protocol (if indicated): yes DVT prophylaxis: lovenox GI prophylaxis: PPI   Glucose control: SSI Mobility: bed rest Code Status: full Family Communication:attempted to update sister via phone, no answer. Will try again later.  Disposition: ICU  Labs   CBC: Recent Labs  Lab 09/26/19 0451 09/26/19 1832 09/27/19 0439 09/27/19 2341 09/28/19 0500 09/28/19 1005 09/29/19 0445 09/29/19 0445 09/29/19 1117 09/29/19 2346 10/01/19 0051 10/01/19 0411 10/02/19 0452  WBC 4.4  --  5.6  --  11.8*  --  12.2*  --   --   --   --  13.9*  --   NEUTROABS 3.2  --  4.0  --  8.8*  --  9.0*  --   --   --   --  8.9*  --   HGB 11.7*   < > 11.2*   < > 12.0   < > 11.1*   < > 13.3 11.9* 13.6 11.6* 14.3  HCT 38.4   < > 37.4   < > 40.6   < > 36.5   < > 39.0 35.0* 40.0 38.2 42.0  MCV 69.2*  --  69.6*  --  71.0*  --  70.1*  --   --   --   --  69.5*  --   PLT 299  --  307  --  382  --  322  --   --   --   --  341  --    < > = values in this interval not displayed.    Basic Metabolic Panel: Recent Labs  Lab 09/26/19 0451 09/26/19 1832 09/27/19 0439 09/27/19 0439 09/27/19 2130 09/27/19 2341 09/28/19 0500 09/28/19 1005 09/29/19 0445 09/29/19 1117 09/29/19 2346 09/30/19 0300 10/01/19 0051 10/01/19 0411 10/02/19 0452  NA 137   < > 140   < > 139   < > 141    < > 139   < > 137 140 141 142 138  K 4.2   < > 4.4   < > 4.5   < > 4.7   < > 4.2   < > 4.4 4.3 4.4 4.7 4.1  CL 104  --  103   < > 103  --  100  --  102  --   --  101  --  102  --   CO2 22  --  24   < > 27  --  26  --  28  --   --  30  --  30  --   GLUCOSE 201*  --  191*   < > 216*  --  179*  --  204*  --   --  161*  --  126*  --   BUN 18  --  26*   < > 37*  --  48*  --  29*  --   --  21*  --  21*  --   CREATININE 0.62  --  0.76   < > 0.99  --  1.20*  --  0.64  --   --  0.50  --  0.44  --   CALCIUM 8.7*  --  8.6*   < > 8.8*  --  8.9  --  8.5*  --   --  8.7*  --  8.5*  --   MG 2.6*  --  2.5*  --  2.6*  --  2.8*  --   --   --   --   --   --   --   --   PHOS 4.3  --  4.1  --   --   --  6.8*  --   --   --   --   --   --   --   --    < > = values in this interval not displayed.   GFR: Estimated Creatinine Clearance: 124.5 mL/min (by C-G formula based on SCr of 0.44 mg/dL). Recent Labs  Lab 09/27/19 0439 09/28/19 0500 09/29/19 0445 10/01/19 0411  WBC 5.6 11.8* 12.2* 13.9*    Liver Function Tests: Recent Labs  Lab 09/27/19 0439 09/28/19 0500 09/29/19 0445 09/30/19 0300 10/01/19 0411  AST 35 65* 50* 40 51*  ALT 29 49* 59* 60* 70*  ALKPHOS 46 52 45 42 47  BILITOT 0.5 0.5 0.4 0.6 0.5  PROT 5.5* 6.3* 5.5* 5.3* 5.6*  ALBUMIN 2.4* 2.9* 2.7* 2.6* 2.7*   No results for input(s): LIPASE, AMYLASE in the last 168 hours. No results for input(s): AMMONIA in the last 168 hours.  ABG    Component Value Date/Time   PHART 7.464 (H) 10/02/2019 0452   PCO2ART 41.0 10/02/2019 0452   PO2ART 75.0 (L) 10/02/2019 0452   HCO3 29.4 (H) 10/02/2019 0452   TCO2 31 10/02/2019 0452   ACIDBASEDEF 1.0 09/26/2019 1832   O2SAT 96.0 10/02/2019 0452     Coagulation Profile: No results for input(s): INR, PROTIME in the last 168 hours.  Cardiac Enzymes: No results for input(s): CKTOTAL, CKMB, CKMBINDEX, TROPONINI in the last 168 hours.  HbA1C: Hgb A1c MFr Bld  Date/Time Value Ref Range Status    01-Oct-2019 05:02 PM 6.4 (H) 4.8 - 5.6 % Final    Comment:    (NOTE) Pre diabetes:          5.7%-6.4% Diabetes:              >6.4% Glycemic control for   <7.0% adults with diabetes     CBG: Recent Labs  Lab 10/01/19 1512 10/01/19 1957 10/01/19 2307 10/02/19 0257 10/02/19 0719  GLUCAP 168* 148* 117* 112* 107*    This patient is critically ill with multiple organ system failure which requires frequent high complexity decision making, assessment, support, evaluation, and titration of therapies. This was completed through the application of advanced monitoring technologies and extensive interpretation of multiple databases. During this encounter critical care time was devoted to patient care services described in this note for 39 minutes.  Briant Sites, MD 10/02/19 9:22 AM [facility]

## 2019-10-03 LAB — POCT I-STAT 7, (LYTES, BLD GAS, ICA,H+H)
Acid-Base Excess: 8 mmol/L — ABNORMAL HIGH (ref 0.0–2.0)
Bicarbonate: 32 mmol/L — ABNORMAL HIGH (ref 20.0–28.0)
Calcium, Ion: 1.24 mmol/L (ref 1.15–1.40)
HCT: 38 % (ref 36.0–46.0)
Hemoglobin: 12.9 g/dL (ref 12.0–15.0)
O2 Saturation: 96 %
Patient temperature: 98.6
Potassium: 4.3 mmol/L (ref 3.5–5.1)
Sodium: 138 mmol/L (ref 135–145)
TCO2: 33 mmol/L — ABNORMAL HIGH (ref 22–32)
pCO2 arterial: 41.8 mmHg (ref 32.0–48.0)
pH, Arterial: 7.492 — ABNORMAL HIGH (ref 7.350–7.450)
pO2, Arterial: 77 mmHg — ABNORMAL LOW (ref 83.0–108.0)

## 2019-10-03 LAB — GLUCOSE, CAPILLARY
Glucose-Capillary: 104 mg/dL — ABNORMAL HIGH (ref 70–99)
Glucose-Capillary: 108 mg/dL — ABNORMAL HIGH (ref 70–99)
Glucose-Capillary: 104 mg/dL — ABNORMAL HIGH (ref 70–99)
Glucose-Capillary: 128 mg/dL — ABNORMAL HIGH (ref 70–99)
Glucose-Capillary: 147 mg/dL — ABNORMAL HIGH (ref 70–99)
Glucose-Capillary: 145 mg/dL — ABNORMAL HIGH (ref 70–99)

## 2019-10-03 LAB — HIGH SENSITIVITY CRP: CRP, High Sensitivity: 4.43 mg/L — ABNORMAL HIGH (ref 0.00–3.00)

## 2019-10-03 MED ORDER — DEXAMETHASONE SODIUM PHOSPHATE 10 MG/ML IJ SOLN
10.0000 mg | Freq: Four times a day (QID) | INTRAMUSCULAR | Status: AC
  Administered 2019-10-03 – 2019-10-04 (×4): 10 mg via INTRAVENOUS
  Filled 2019-10-03 (×4): qty 1

## 2019-10-03 MED ORDER — IPRATROPIUM-ALBUTEROL 20-100 MCG/ACT IN AERS
1.0000 | INHALATION_SPRAY | Freq: Four times a day (QID) | RESPIRATORY_TRACT | Status: DC
  Filled 2019-10-03: qty 4

## 2019-10-03 MED ORDER — RACEPINEPHRINE HCL 2.25 % IN NEBU
0.5000 mL | INHALATION_SOLUTION | RESPIRATORY_TRACT | Status: AC | PRN
  Administered 2019-10-03 (×3): 0.5 mL via RESPIRATORY_TRACT
  Filled 2019-10-03 (×5): qty 0.5

## 2019-10-03 MED ORDER — DEXAMETHASONE SODIUM PHOSPHATE 10 MG/ML IJ SOLN: 10.0000 mg | Freq: Once | INTRAMUSCULAR | Status: AC

## 2019-10-03 MED ORDER — RACEPINEPHRINE HCL 2.25 % IN NEBU
INHALATION_SOLUTION | RESPIRATORY_TRACT | Status: AC
  Administered 2019-10-03: 0.5 mL via RESPIRATORY_TRACT
  Filled 2019-10-03: qty 0.5

## 2019-10-03 MED ORDER — ENOXAPARIN SODIUM 80 MG/0.8ML ~~LOC~~ SOLN
75.0000 mg | Freq: Two times a day (BID) | SUBCUTANEOUS | Status: DC
  Administered 2019-10-03 – 2019-10-04 (×4): 75 mg via SUBCUTANEOUS
  Filled 2019-10-03 (×4): qty 0.8

## 2019-10-03 MED ORDER — RACEPINEPHRINE HCL 2.25 % IN NEBU: 0.5000 mL | INHALATION_SOLUTION | RESPIRATORY_TRACT | Status: DC | PRN

## 2019-10-03 MED ORDER — DEXAMETHASONE SODIUM PHOSPHATE 10 MG/ML IJ SOLN
INTRAMUSCULAR | Status: AC
  Administered 2019-10-03: 10 mg via INTRAVENOUS
  Filled 2019-10-03: qty 1

## 2019-10-03 NOTE — Progress Notes (Signed)
NAME:  Summer Guzman, MRN:  [id number], DOB:  1966/02/06, LOS: 10 ADMISSION DATE:  09/24/19, CONSULTATION DATE:  3/22 REFERRING MD: Butler Denmark, CHIEF COMPLAINT:  Dyspnea   Brief History   54 y/o female admitted to [facility] with ARDS due to COVID pneumonia on 3/21.  Intubated 3/22.  History of present illness   This is a pleasant 54 y/o female who tested positive for COVID on 3/17.  She believed she became infected after exposure to sick family.  She presented to [facility] on 3/21 with dyspnea, cough, found to be very hypoxemic.  Received Actemra, remdesivir, decadron.  Her respiratory rate continued to climb after admission and her O2 saturation dropped.  She complained of feeling more dyspneic.  After discussion with the patient it was decided to place her on mechanical ventilation. Developed atrial fib with RVR.   Past Medical History  asthma  Significant Hospital Events   3/21 admission 3/22 intubation, transfer [facility] to [facility]  3/27 hypoxia improving  Consults:  PCCM  Procedures:  3/22 ETT >  3/22 L IJ CVL >  3/22 radial A-line> removed  Significant Diagnostic Tests:  3/21 TTE> LVEF 70-75%, RV function normal, valves OK  Micro Data:  3/17 SARS COV 2 > positive outside [place] 3/21 blood > neg 3/22 sputum> NG  3/27 resp: ngtd  Antimicrobials/COVID Rx:  3/21 cefepime x1 3/21 tocilizumab x1 3/21 remdesivir > 3/25 3/21 decadron > 3/30  Interim history/subjective:  3/31: tolerated cpap all day yesterday. Awaiting more awakened state this am while wean sedation and then will extubate. Pt on ps 40/5 3/30: on cpap, tolerated til evening when she had increased wob yesterday and dysynchrony overnight. Pt increased on propofol as well overnight and thus drowsy this am. Will attempt to wean off propofol today in favor of precedex and prn sedation to optimize pt for sbt's and extubation in next day or so.  3/29:Weaning down on fentanyl, tolerating PSV   Objective   Blood pressure  101/68, pulse 80, temperature 99.7 F (37.6 C), temperature source Oral, resp. rate (!) 22, height 5\' 6"  (1.676 m), weight (!) 149.2 kg, last menstrual period 09/21/2019, SpO2 96 %.    Vent Mode: CPAP;PSV FiO2 (%):  [40 %] 40 % Set Rate:  [22 bmp] 22 bmp Vt Set:  [470 mL] 470 mL PEEP:  [5 cmH20] 5 cmH20 Pressure Support:  [5 cmH20-10 cmH20] 5 cmH20 Plateau Pressure:  [19 cmH20] 19 cmH20   Intake/Output Summary (Last 24 hours) at 10/03/2019 0956 Last data filed at 10/03/2019 0900 Gross per 24 hour  Intake 2085.21 ml  Output 2825 ml  Net -739.79 ml   Filed Weights   10/01/19 0428 10/02/19 0500 10/03/19 0500  Weight: (!) 150.1 kg (!) 150.7 kg (!) 149.2 kg    Examination:  General: NAD, intubated, nods intermittently to voice (on propofol and dex) HENT: [place]/AT [place], perrl mmmp PULM: diminished bilaterally, No current dyssynchrony CV: rrr GI:, Soft, nontender, nondistended Extremities: no edema, erythema, no clubbing or cyanosis Neuro: opens eyes to voice, following commands but not briskly this am.   On PSV - bradypneic, low tidal volumes Resolved Hospital Problem list    Assessment & Plan:  Severe ARDS due to COVID 19 pneumonia: -weaning vent, on ps this am 40 5/5 will move to extubate when more brisk -No longer requiring proning -stop propofol as tolerates.  -favor precedex, can maintain low dose when extubated if necessary as well.  -Conservative fluid strategy.  -VAP prevention protocol -  completed decadron -Previously received tocilizumab, and remdesivir x 5 days -chemoprophy lovenox, stop therapeutic dosing as since initial presentation with afib she has been in sinus for at least last 5 days.   Nutrition: -swallow eval once extubated per protocol -Continue monitoring electrolytes  Atrial fib with RVR, new onset. Back in NSR Slight sinus brady, intermittent tachycardia.  -Continue telemetry monitoring -considering afib isolated event, will stop therapeutic lovenox  and utilize chemoprophy dosing for covid pt (bloody oral secretions).  -should pt go back into afib with improved resp status will need to reconsider this.   HTN: Monitor, intermittent. hydral prn.    Hyperglycemia with prediabetes -a1c 6.4 -steroids completed -Continue Lantus but close monitoring, esp in light of stopping tf and steroids have now completed.  -stop scheduled novolog coverage with cessation of tf -Accu-Cheks every 4 hours with sliding scale insulin as needed. -Goal blood glucose 140-180 while admitted to the ICU  Acute anemia likely due to critical illness -Transfuse for hemoglobin less than 7 -Continue to monitor  Leukocytosis afebrile -UA nl  -Cont to monitor for signs of infection.  -Trach aspirate: neg -cbc for tomorrow  Elevated transaminase level- possibly due to course of remdesivir -con't to monitor  -cmp tomorrow am  Hx of Asthma - albuterol prn prior to admission Cont nebs  Best practice:  Diet: advance as tolerated after swallow eval Pain/Anxiety/Delirium protocol (if indicated): ordered  VAP protocol (if indicated): yes DVT prophylaxis: lovenox GI prophylaxis: PPI  Stopped with cessation of steroids (not on at home) Glucose control: SSI Mobility: bed rest Code Status: full Family Communication:updated sister via phone Disposition: ICU, anticipate moving out of ICU in next 24 hours  Labs   CBC: Recent Labs  Lab 09/27/19 0439 09/27/19 2341 09/28/19 0500 09/28/19 1005 09/29/19 0445 09/29/19 1117 09/29/19 2346 10/01/19 0051 10/01/19 0411 10/02/19 0452 10/03/19 0417  WBC 5.6  --  11.8*  --  12.2*  --   --   --  13.9*  --   --   NEUTROABS 4.0  --  8.8*  --  9.0*  --   --   --  8.9*  --   --   HGB 11.2*   < > 12.0   < > 11.1*   < > 11.9* 13.6 11.6* 14.3 12.9  HCT 37.4   < > 40.6   < > 36.5   < > 35.0* 40.0 38.2 42.0 38.0  MCV 69.6*  --  71.0*  --  70.1*  --   --   --  69.5*  --   --   PLT 307  --  382  --  322  --   --   --  341  --    --    < > = values in this interval not displayed.    Basic Metabolic Panel: Recent Labs  Lab 09/27/19 0439 09/27/19 0439 09/27/19 2130 09/27/19 2341 09/28/19 0500 09/28/19 1005 09/29/19 0445 09/29/19 1117 09/30/19 0300 10/01/19 0051 10/01/19 0411 10/02/19 0452 10/03/19 0417  NA 140   < > 139   < > 141   < > 139   < > 140 141 142 138 138  K 4.4   < > 4.5   < > 4.7   < > 4.2   < > 4.3 4.4 4.7 4.1 4.3  CL 103   < > 103  --  100  --  102  --  101  --  102  --   --  CO2 24   < > 27  --  26  --  28  --  30  --  30  --   --   GLUCOSE 191*   < > 216*  --  179*  --  204*  --  161*  --  126*  --   --   BUN 26*   < > 37*  --  48*  --  29*  --  21*  --  21*  --   --   CREATININE 0.76   < > 0.99  --  1.20*  --  0.64  --  0.50  --  0.44  --   --   CALCIUM 8.6*   < > 8.8*  --  8.9  --  8.5*  --  8.7*  --  8.5*  --   --   MG 2.5*  --  2.6*  --  2.8*  --   --   --   --   --   --   --   --   PHOS 4.1  --   --   --  6.8*  --   --   --   --   --   --   --   --    < > = values in this interval not displayed.   GFR: Estimated Creatinine Clearance: 123.8 mL/min (by C-G formula based on SCr of 0.44 mg/dL). Recent Labs  Lab 09/27/19 0439 09/28/19 0500 09/29/19 0445 10/01/19 0411  WBC 5.6 11.8* 12.2* 13.9*    Liver Function Tests: Recent Labs  Lab 09/27/19 0439 09/28/19 0500 09/29/19 0445 09/30/19 0300 10/01/19 0411  AST 35 65* 50* 40 51*  ALT 29 49* 59* 60* 70*  ALKPHOS 46 52 45 42 47  BILITOT 0.5 0.5 0.4 0.6 0.5  PROT 5.5* 6.3* 5.5* 5.3* 5.6*  ALBUMIN 2.4* 2.9* 2.7* 2.6* 2.7*   No results for input(s): LIPASE, AMYLASE in the last 168 hours. No results for input(s): AMMONIA in the last 168 hours.  ABG    Component Value Date/Time   PHART 7.492 (H) 10/03/2019 0417   PCO2ART 41.8 10/03/2019 0417   PO2ART 77.0 (L) 10/03/2019 0417   HCO3 32.0 (H) 10/03/2019 0417   TCO2 33 (H) 10/03/2019 0417   ACIDBASEDEF 1.0 09/26/2019 1832   O2SAT 96.0 10/03/2019 0417     Coagulation  Profile: No results for input(s): INR, PROTIME in the last 168 hours.  Cardiac Enzymes: No results for input(s): CKTOTAL, CKMB, CKMBINDEX, TROPONINI in the last 168 hours.  HbA1C: Hgb A1c MFr Bld  Date/Time Value Ref Range Status  2019-10-19 05:02 PM 6.4 (H) 4.8 - 5.6 % Final    Comment:    (NOTE) Pre diabetes:          5.7%-6.4% Diabetes:              >6.4% Glycemic control for   <7.0% adults with diabetes     CBG: Recent Labs  Lab 10/02/19 1529 10/02/19 1931 10/02/19 2320 10/03/19 0324 10/03/19 0744  GLUCAP 163* 135* 102* 104* 108*    This patient is critically ill with multiple organ system failure which requires frequent high complexity decision making, assessment, support, evaluation, and titration of therapies. This was completed through the application of advanced monitoring technologies and extensive interpretation of multiple databases. During this encounter critical care time was devoted to patient care services described in this note for 41 minutes.    Briant Sites,  MD 10/03/19 9:56 AM [facility]

## 2019-10-03 NOTE — Progress Notes (Signed)
Patient was extubated at [phone]. She was disoriented x4. Able to follow commands. She was extubated on 4L [place].   Sister, Hector Shade, was updated and informed that patient was extubated today. All questions were answered.

## 2019-10-03 NOTE — Progress Notes (Signed)
Patient had stridor and was placed on BiPaP this afternoon. She is still alert to self and able to follow commands.

## 2019-10-03 NOTE — Procedures (Signed)
Extubation Procedure Note  Patient Details:   Name: Summer Guzman DOB: March 01, 1966 MRN: [id number]   Airway Documentation:    Vent end date: 10/03/19 Vent end time: 0942   Evaluation  O2 sats: stable throughout Complications: No apparent complications Patient did tolerate procedure well. Bilateral Breath Sounds: Rhonchi, Diminished   Patient extubated per MD order & placed on 4L [place]. Patient has good cough & able to speak. IS instructed 1000x5.  Jacqulynn Cadet 10/03/2019, 9:53 AM

## 2019-10-03 NOTE — Progress Notes (Signed)
Sister, Hector Shade, made aware that we are unable to give the patient's belongings to her until the patient can consent/allow [place] to do so.

## 2019-10-03 NOTE — Progress Notes (Signed)
Assisted tele visit to patient with family members.  Atiana Levier M Henley Boettner, RN   

## 2019-10-03 NOTE — Progress Notes (Signed)
Assisted tele visit to patient with family members.  Summer Guzman M Macee Venables, RN   

## 2019-10-04 ENCOUNTER — Encounter (HOSPITAL_COMMUNITY): Payer: Self-pay

## 2019-10-04 LAB — COMPREHENSIVE METABOLIC PANEL
ALT: 191 U/L — ABNORMAL HIGH (ref 0–44)
AST: 130 U/L — ABNORMAL HIGH (ref 15–41)
Albumin: 2.9 g/dL — ABNORMAL LOW (ref 3.5–5.0)
Alkaline Phosphatase: 48 U/L (ref 38–126)
Anion gap: 8 (ref 5–15)
BUN: 20 mg/dL (ref 6–20)
CO2: 29 mmol/L (ref 22–32)
Calcium: 9.1 mg/dL (ref 8.9–10.3)
Chloride: 101 mmol/L (ref 98–111)
Creatinine, Ser: 0.53 mg/dL (ref 0.44–1.00)
GFR calc Af Amer: >60 mL/min (ref >60)
GFR calc non Af Amer: >60 mL/min (ref >60)
Glucose, Bld: 96 mg/dL (ref 70–99)
Potassium: 4.7 mmol/L (ref 3.5–5.1)
Sodium: 138 mmol/L (ref 135–145)
Total Bilirubin: 0.8 mg/dL (ref 0.3–1.2)
Total Protein: 5.6 g/dL — ABNORMAL LOW (ref 6.5–8.1)

## 2019-10-04 LAB — CBC
HCT: 38.1 % (ref 36.0–46.0)
Hemoglobin: 11.6 g/dL — ABNORMAL LOW (ref 12.0–15.0)
MCH: 20.8 pg — ABNORMAL LOW (ref 26.0–34.0)
MCHC: 30.4 g/dL (ref 30.0–36.0)
MCV: 68.4 fL — ABNORMAL LOW (ref 80.0–100.0)
Platelets: 276 10*3/uL (ref 150–400)
RBC: 5.57 MIL/uL — ABNORMAL HIGH (ref 3.87–5.11)
RDW: 18.9 % — ABNORMAL HIGH (ref 11.5–15.5)
WBC: 12.9 10*3/uL — ABNORMAL HIGH (ref 4.0–10.5)
nRBC: 0 % (ref 0.0–0.2)

## 2019-10-04 LAB — GLUCOSE, CAPILLARY
Glucose-Capillary: 128 mg/dL — ABNORMAL HIGH (ref 70–99)
Glucose-Capillary: 135 mg/dL — ABNORMAL HIGH (ref 70–99)
Glucose-Capillary: 80 mg/dL (ref 70–99)
Glucose-Capillary: 88 mg/dL (ref 70–99)
Glucose-Capillary: 95 mg/dL (ref 70–99)

## 2019-10-04 MED ORDER — IPRATROPIUM-ALBUTEROL 20-100 MCG/ACT IN AERS
1.0000 | INHALATION_SPRAY | Freq: Four times a day (QID) | RESPIRATORY_TRACT | Status: DC
  Administered 2019-10-04 – 2019-10-05 (×3): 1 via RESPIRATORY_TRACT
  Filled 2019-10-04: qty 4

## 2019-10-04 MED ORDER — ORAL CARE MOUTH RINSE
15.0000 mL | Freq: Two times a day (BID) | OROMUCOSAL | Status: DC
  Administered 2019-10-04 – 2019-10-09 (×10): 15 mL via OROMUCOSAL

## 2019-10-04 MED ORDER — IPRATROPIUM-ALBUTEROL 0.5-2.5 (3) MG/3ML IN SOLN
3.0000 mL | Freq: Four times a day (QID) | RESPIRATORY_TRACT | Status: DC
  Administered 2019-10-04: 3 mL via RESPIRATORY_TRACT
  Filled 2019-10-04: qty 3

## 2019-10-04 MED ORDER — "THROMBI-PAD 3""X3"" EX PADS"
1.0000 | MEDICATED_PAD | Freq: Once | CUTANEOUS | Status: AC
  Administered 2019-10-04: 1 via TOPICAL
  Filled 2019-10-04: qty 1

## 2019-10-04 MED ORDER — CHLORHEXIDINE GLUCONATE 0.12 % MT SOLN
15.0000 mL | Freq: Two times a day (BID) | OROMUCOSAL | Status: DC
  Administered 2019-10-04 – 2019-10-09 (×12): 15 mL via OROMUCOSAL
  Filled 2019-10-04 (×11): qty 15

## 2019-10-04 NOTE — Progress Notes (Signed)
Pt tx from bed to recliner, stand and pivot. Required assist x 2, and barely able to bear wt to tx to chair with arm down next to bed. VSS x mild transient tachycardia. O2 sats stable on 2L [place]. Have ordered bari chair to facillitate future tx and comfort.

## 2019-10-04 NOTE — Progress Notes (Addendum)
Pt w/ BIPAP off, placed on [place] 4L w/o distress, no audible wheeze. A & O x 3, disoriented to time but reorients easily. Sister, Hector Shade, and other family members w/ video visit and much comfort to them and pt. Emotional support provided.  Rcvd clarification from Suzette that their mother had covid, but has recovered, released home, and enjoying her previous state of health.

## 2019-10-04 NOTE — Progress Notes (Signed)
Assisted tele visit to patient with family member.  Kelcey Korus R, RN  

## 2019-10-04 NOTE — Progress Notes (Signed)
Nutrition Follow-up  DOCUMENTATION CODES:   Morbid obesity  INTERVENTION:   Ensure Enlive po TID, each supplement provides 350 kcal and 20 grams of protein  Continue MVI with Minerals  NUTRITION DIAGNOSIS:   Inadequate oral intake related to inability to eat as evidenced by NPO status.  Being addressed as diet advanced, supplements  GOAL:   Patient will meet greater than or equal to 90% of their needs  Progressing  MONITOR:   PO intake, Supplement acceptance, Labs, Weight trends, Skin  REASON FOR ASSESSMENT:   Consult, Malnutrition Screening Tool, Ventilator Enteral/tube feeding initiation and management  ASSESSMENT:   54 year old female with past medical history of asthma who recently moved here from [place] presented to ED with worsening SOB over the past week and reports cough, nausea, vomiting, diarrhea, weakness and body aches and admitted on 3/21 for acute respiratory failure due to COVID-19 pneumonia.  RD working remotely.  3/21 Admitted 3/22 Intubated 3/31 Extubated  Diet advanced to Dysphagia 3 today; no po intake yet  No skin breakdown per RN skin assessment  Weight up since admission but pt net positive 8 L  Noted hypoglycemia likely related to a combination of factors including NPO status (TF d/c post extubation), decadron discontinued and last received yesterday and pt received lantus and scheduled novolog yesterday. Currently only on sliding scale and diet has been ordered  Labs: potassium 3.1 (L), CBGs 63-95 Meds: ss novolog, MVI with minerals, Vit C  Diet Order:   Diet Order            DIET DYS 3 Room service appropriate? Yes; Fluid consistency: Thin  Diet effective now              EDUCATION NEEDS:   Not appropriate for education at this time  Skin:  Skin Assessment: Reviewed RN Assessment  Last BM:  4/2 rectal tube  Height:   Ht Readings from Last 1 Encounters:  10-08-19 5\' 6"  (1.676 m)    Weight:   Wt Readings from  Last 1 Encounters:  10/05/19 (!) 152.5 kg    Ideal Body Weight:  (IBW 59 kg, Ajdusted BW 91 kg)  BMI:  Body mass index is 54.26 kg/m.  Estimated Nutritional Needs:   Kcal:  12/05/19 kcals  Protein:  120-148  Fluid:  >/= 2 L/day   [id number] MS, RDN, LDN, CNSC RD Pager Number and Weekend/On-Call After Hours Pager Located in [place]

## 2019-10-04 NOTE — Progress Notes (Signed)
NAME:  Summer Guzman, MRN:  [id number], DOB:  [birth year], LOS: 68 ADMISSION DATE:  10/21/2019, CONSULTATION DATE:  3/22 REFERRING MD: Wynelle Cleveland, CHIEF COMPLAINT:  Dyspnea   Brief History   54 y/o female admitted to [facility] with ARDS due to COVID pneumonia on 3/21.  Intubated 3/22.  History of present illness   This is a pleasant 54 y/o female who tested positive for COVID on 3/17.  She believed she became infected after exposure to sick family.  She presented to [facility] on 3/21 with dyspnea, cough, found to be very hypoxemic.  Received Actemra, remdesivir, decadron.  Her respiratory rate continued to climb after admission and her O2 saturation dropped.  She complained of feeling more dyspneic.  After discussion with the patient it was decided to place her on mechanical ventilation. Developed atrial fib with RVR.   Past Medical History  asthma  Significant Hospital Events   3/21 admission 3/22 intubation, transfer from [facility] to [facility]  3/27 hypoxia improving  Consults:  PCCM  Procedures:  3/22 ETT >  3/22 L IJ CVL >  3/22 radial A-line> removed  Significant Diagnostic Tests:  3/21 TTE> LVEF 70-75%, RV function normal, valves OK  Micro Data:  3/17 SARS COV 2 > positive outside [place] 3/21 blood > neg 3/22 sputum> NG  3/27 resp: ngtd  Antimicrobials/COVID Rx:  3/21 cefepime x1 3/21 tocilizumab x1 3/21 remdesivir > 3/25 3/21 decadron > 3/30  Interim history/subjective:  4/1: overnight with some stridor. No desaturation. Started steroids and racemic epi (via NIV 2/2 covid infection). Improved this am. On 4L [place]. Weaning sedation (on precedex infusion) 3/31: tolerated cpap all day yesterday. Awaiting more awakened state this am while wean sedation and then will extubate. Pt on ps 40/5 3/30: on cpap, tolerated til evening when she had increased wob yesterday and dysynchrony overnight. Pt increased on propofol as well overnight and thus drowsy this am. Will attempt to wean off propofol  today in favor of precedex and prn sedation to optimize pt for sbt's and extubation in next day or so.  3/29:Weaning down on fentanyl, tolerating PSV   Objective   Blood pressure 119/77, pulse 83, temperature (!) 96.7 F (35.9 C), temperature source Axillary, resp. rate 12, height 5\' 6"  (1.676 m), weight (!) 153.4 kg, last menstrual period 09/21/2019, SpO2 93 %.    Vent Mode: BIPAP FiO2 (%):  [40 %] 40 % Set Rate:  [2 bmp-15 bmp] 15 bmp PEEP:  [5 cmH20] 5 cmH20   Intake/Output Summary (Last 24 hours) at 10/04/2019 0941 Last data filed at 10/04/2019 0800 Gross per 24 hour  Intake 1000.01 ml  Output 3625 ml  Net -2624.99 ml   Filed Weights   10/02/19 0500 10/03/19 0500 10/04/19 0414  Weight: (!) 150.7 kg (!) 149.2 kg (!) 153.4 kg    Examination:  General: NAD, drowsy but arousable and following commands (+hallucinations) on precedex at 1.2 HENT: [place]/AT eomi, perrl mmmp, NIV in place PULM: diminished bilaterally, No current dyssynchrony CV: rrr GI:, Soft, nontender, nondistended Extremities: no edema, erythema, no clubbing or cyanosis Neuro: on precedex but arousable  Resolved Hospital Problem list    Assessment & Plan:  Severe ARDS due to COVID 19 pneumonia: -improving on 4L [place], utilized NIV for racemic epi inhalation 2/2 stridor -wean precedex, can maintain low dose when extubated if necessary as well.  -Conservative fluid strategy.  -VAP prevention protocol -completed decadron... resume x4 doses for stridor -Previously received tocilizumab, and remdesivir x 5 days -  chemoprophy lovenox  Nutrition: -swallow eval once extubated per protocol -Continue monitoring electrolytes  Atrial fib with RVR, new onset. Back in NSR Slight sinus brady, intermittent tachycardia.  -Continue telemetry monitoring -considering afib isolated event, stopped therapeutic lovenox and utilize chemoprophy dosing for covid pt (bloody oral secretions).  -should pt go back into afib with improved resp  status will need to reconsider this.   HTN: Monitor, intermittent. hydral prn.    Hyperglycemia with prediabetes -a1c 6.4 -expect some level of hyperglycemia today with bolus steroids for stridor yesterday and today. Will hold on insulin adjustments in light of npo and risk of hypoglycemia -Continue Lantus but close monitoring -Accu-Cheks every 4 hours with sliding scale insulin as needed. -Goal blood glucose 140-180 while admitted to the ICU  Acute anemia likely due to critical illness -Transfuse for hemoglobin less than 7 -Continue to monitor  Leukocytosis: downtrending afebrile -UA nl  -Cont to monitor for signs of infection.  -Trach aspirate: neg  Elevated transaminase level- possibly due to course of remdesivir -con't to monitor  -cmp not collected as ordered will reorder for tomorrow  Hx of Asthma - albuterol prn prior to admission Cont nebs  Best practice:  Diet: advance as tolerated after swallow eval Pain/Anxiety/Delirium protocol (if indicated): per protocol VAP protocol (if indicated): n/a DVT prophylaxis: lovenox GI prophylaxis: PPI  Stopped with cessation of steroids (not on at home) Glucose control: SSI Mobility: bed rest Code Status: full Family Communication:updated sister via phone Disposition: ICU, anticipate moving out of ICU in next 24 hours  Labs   CBC: Recent Labs  Lab 09/28/19 0500 09/28/19 1005 09/29/19 0445 09/29/19 1117 10/01/19 0051 10/01/19 0411 10/02/19 0452 10/03/19 0417 10/04/19 0429  WBC 11.8*  --  12.2*  --   --  13.9*  --   --  12.9*  NEUTROABS 8.8*  --  9.0*  --   --  8.9*  --   --   --   HGB 12.0   < > 11.1*   < > 13.6 11.6* 14.3 12.9 11.6*  HCT 40.6   < > 36.5   < > 40.0 38.2 42.0 38.0 38.1  MCV 71.0*  --  70.1*  --   --  69.5*  --   --  68.4*  PLT 382  --  322  --   --  341  --   --  276   < > = values in this interval not displayed.    Basic Metabolic Panel: Recent Labs  Lab 09/27/19 2130 09/27/19 2341  09/28/19 0500 09/28/19 1005 09/29/19 0445 09/29/19 1117 09/30/19 0300 10/01/19 0051 10/01/19 0411 10/02/19 0452 10/03/19 0417  NA 139   < > 141   < > 139   < > 140 141 142 138 138  K 4.5   < > 4.7   < > 4.2   < > 4.3 4.4 4.7 4.1 4.3  CL 103  --  100  --  102  --  101  --  102  --   --   CO2 27  --  26  --  28  --  30  --  30  --   --   GLUCOSE 216*  --  179*  --  204*  --  161*  --  126*  --   --   BUN 37*  --  48*  --  29*  --  21*  --  21*  --   --  CREATININE 0.99  --  1.20*  --  0.64  --  0.50  --  0.44  --   --   CALCIUM 8.8*  --  8.9  --  8.5*  --  8.7*  --  8.5*  --   --   MG 2.6*  --  2.8*  --   --   --   --   --   --   --   --   PHOS  --   --  6.8*  --   --   --   --   --   --   --   --    < > = values in this interval not displayed.   GFR: Estimated Creatinine Clearance: 125.8 mL/min (by C-G formula based on SCr of 0.44 mg/dL). Recent Labs  Lab 09/28/19 0500 09/29/19 0445 10/01/19 0411 10/04/19 0429  WBC 11.8* 12.2* 13.9* 12.9*    Liver Function Tests: Recent Labs  Lab 09/28/19 0500 09/29/19 0445 09/30/19 0300 10/01/19 0411  AST 65* 50* 40 51*  ALT 49* 59* 60* 70*  ALKPHOS 52 45 42 47  BILITOT 0.5 0.4 0.6 0.5  PROT 6.3* 5.5* 5.3* 5.6*  ALBUMIN 2.9* 2.7* 2.6* 2.7*   No results for input(s): LIPASE, AMYLASE in the last 168 hours. No results for input(s): AMMONIA in the last 168 hours.  ABG    Component Value Date/Time   PHART 7.492 (H) 10/03/2019 0417   PCO2ART 41.8 10/03/2019 0417   PO2ART 77.0 (L) 10/03/2019 0417   HCO3 32.0 (H) 10/03/2019 0417   TCO2 33 (H) 10/03/2019 0417   ACIDBASEDEF 1.0 09/26/2019 1832   O2SAT 96.0 10/03/2019 0417     Coagulation Profile: No results for input(s): INR, PROTIME in the last 168 hours.  Cardiac Enzymes: No results for input(s): CKTOTAL, CKMB, CKMBINDEX, TROPONINI in the last 168 hours.  HbA1C: Hgb A1c MFr Bld  Date/Time Value Ref Range Status  10/07/19 05:02 PM 6.4 (H) 4.8 - 5.6 % Final    Comment:     (NOTE) Pre diabetes:          5.7%-6.4% Diabetes:              >6.4% Glycemic control for   <7.0% adults with diabetes     CBG: Recent Labs  Lab 10/03/19 1551 10/03/19 1931 10/03/19 2311 10/04/19 0314 10/04/19 0738  GLUCAP 128* 147* 145* 128* 135*    This patient is critically ill with multiple organ system failure which requires frequent high complexity decision making, assessment, support, evaluation, and titration of therapies. This was completed through the application of advanced monitoring technologies and extensive interpretation of multiple databases. During this encounter critical care time was devoted to patient care services described in this note for 32 minutes.    Briant Sites, MD 10/04/19 9:41 AM [facility]

## 2019-10-04 DEATH — deceased

## 2019-10-05 ENCOUNTER — Inpatient Hospital Stay (HOSPITAL_COMMUNITY): Payer: No Typology Code available for payment source

## 2019-10-05 LAB — COMPREHENSIVE METABOLIC PANEL
ALT: 230 U/L — ABNORMAL HIGH (ref 0–44)
AST: 171 U/L — ABNORMAL HIGH (ref 15–41)
Albumin: 2.9 g/dL — ABNORMAL LOW (ref 3.5–5.0)
Alkaline Phosphatase: 46 U/L (ref 38–126)
Anion gap: 11 (ref 5–15)
BUN: 17 mg/dL (ref 6–20)
CO2: 28 mmol/L (ref 22–32)
Calcium: 9.1 mg/dL (ref 8.9–10.3)
Chloride: 99 mmol/L (ref 98–111)
Creatinine, Ser: 0.63 mg/dL (ref 0.44–1.00)
GFR calc Af Amer: >60 mL/min (ref >60)
GFR calc non Af Amer: >60 mL/min (ref >60)
Glucose, Bld: 93 mg/dL (ref 70–99)
Potassium: 3.1 mmol/L — ABNORMAL LOW (ref 3.5–5.1)
Sodium: 138 mmol/L (ref 135–145)
Total Bilirubin: 0.9 mg/dL (ref 0.3–1.2)
Total Protein: 5.3 g/dL — ABNORMAL LOW (ref 6.5–8.1)

## 2019-10-05 LAB — GLUCOSE, CAPILLARY
Glucose-Capillary: 100 mg/dL — ABNORMAL HIGH (ref 70–99)
Glucose-Capillary: 63 mg/dL — ABNORMAL LOW (ref 70–99)
Glucose-Capillary: 64 mg/dL — ABNORMAL LOW (ref 70–99)
Glucose-Capillary: 66 mg/dL — ABNORMAL LOW (ref 70–99)
Glucose-Capillary: 76 mg/dL (ref 70–99)
Glucose-Capillary: 84 mg/dL (ref 70–99)
Glucose-Capillary: 85 mg/dL (ref 70–99)

## 2019-10-05 MED ORDER — IPRATROPIUM-ALBUTEROL 0.5-2.5 (3) MG/3ML IN SOLN
3.0000 mL | Freq: Once | RESPIRATORY_TRACT | Status: AC
  Administered 2019-10-06: 3 mL via RESPIRATORY_TRACT
  Filled 2019-10-05: qty 3

## 2019-10-05 MED ORDER — ENOXAPARIN SODIUM 40 MG/0.4ML ~~LOC~~ SOLN
40.0000 mg | Freq: Two times a day (BID) | SUBCUTANEOUS | Status: DC
  Administered 2019-10-05 – 2019-10-09 (×10): 40 mg via SUBCUTANEOUS
  Filled 2019-10-05 (×10): qty 0.4

## 2019-10-05 MED ORDER — MAGNESIUM SULFATE 2 GM/50ML IV SOLN: 2.0000 g | Freq: Once | INTRAVENOUS | Status: AC

## 2019-10-05 MED ORDER — "THROMBI-PAD 3""X3"" EX PADS"
1.0000 | MEDICATED_PAD | Freq: Once | CUTANEOUS | Status: AC
  Administered 2019-10-05: 1 via TOPICAL
  Filled 2019-10-05: qty 1

## 2019-10-05 MED ORDER — FLUTICASONE FUROATE-VILANTEROL 200-25 MCG/INH IN AEPB
1.0000 | INHALATION_SPRAY | Freq: Every day | RESPIRATORY_TRACT | Status: DC
  Administered 2019-10-07 – 2019-10-09 (×3): 1 via RESPIRATORY_TRACT
  Filled 2019-10-05: qty 28

## 2019-10-05 MED ORDER — SODIUM CHLORIDE 0.9% FLUSH: 10.0000 mL | INTRAVENOUS | Status: DC | PRN

## 2019-10-05 MED ORDER — DEXTROSE 50 % IV SOLN
INTRAVENOUS | Status: AC
  Administered 2019-10-05: 50 mL
  Filled 2019-10-05: qty 50

## 2019-10-05 MED ORDER — SODIUM CHLORIDE 0.9% FLUSH
10.0000 mL | Freq: Two times a day (BID) | INTRAVENOUS | Status: DC
  Administered 2019-10-05 – 2019-10-09 (×10): 10 mL

## 2019-10-05 MED ORDER — ENSURE ENLIVE PO LIQD
237.0000 mL | Freq: Three times a day (TID) | ORAL | Status: DC
  Administered 2019-10-05 – 2019-10-07 (×6): 237 mL via ORAL

## 2019-10-05 MED ORDER — POTASSIUM CHLORIDE 20 MEQ/15ML (10%) PO SOLN
40.0000 meq | ORAL | Status: AC
  Administered 2019-10-05 (×2): 40 meq via ORAL
  Filled 2019-10-05 (×2): qty 30

## 2019-10-05 MED ORDER — RACEPINEPHRINE HCL 2.25 % IN NEBU
0.5000 mL | INHALATION_SOLUTION | Freq: Once | RESPIRATORY_TRACT | Status: AC
  Administered 2019-10-06: 0.5 mL via RESPIRATORY_TRACT
  Filled 2019-10-05 (×2): qty 0.5

## 2019-10-05 MED ORDER — ALBUTEROL (5 MG/ML) CONTINUOUS INHALATION SOLN
10.0000 mg/h | INHALATION_SOLUTION | RESPIRATORY_TRACT | Status: DC
  Administered 2019-10-06: 10 mg/h via RESPIRATORY_TRACT
  Filled 2019-10-05: qty 20

## 2019-10-05 MED ORDER — METHYLPREDNISOLONE SODIUM SUCC 125 MG IJ SOLR
125.0000 mg | Freq: Once | INTRAMUSCULAR | Status: AC
  Administered 2019-10-05: 125 mg via INTRAVENOUS
  Filled 2019-10-05: qty 2

## 2019-10-05 MED ORDER — MAGNESIUM SULFATE 2 GM/50ML IV SOLN
INTRAVENOUS | Status: AC
  Administered 2019-10-06: 2 g via INTRAVENOUS
  Filled 2019-10-05: qty 50

## 2019-10-05 MED ORDER — IPRATROPIUM-ALBUTEROL 20-100 MCG/ACT IN AERS
1.0000 | INHALATION_SPRAY | Freq: Four times a day (QID) | RESPIRATORY_TRACT | Status: DC | PRN
  Administered 2019-10-05 – 2019-10-06 (×2): 1 via RESPIRATORY_TRACT
  Filled 2019-10-05: qty 4

## 2019-10-05 MED ORDER — POLYETHYLENE GLYCOL 3350 17 G PO PACK: 17.0000 g | PACK | Freq: Every day | ORAL | Status: DC | PRN

## 2019-10-05 NOTE — Evaluation (Addendum)
Occupational Therapy Evaluation Patient Details Name: Summer Guzman MRN: [id number] DOB: 11-09-1965 Today's Date: 10/05/2019    History of Present Illness 54 y.o. female admitted on 09/10/2019 for SOB.  Found to be COVID (+) 09/19/19 dx with severe ARDS due to COVID 19 PNA.  Noted to be in A-fib with RVR.  Pt intubated on 09/24/19 and extubated  10/03/19.  Pt with significant PMH of asthma.    Clinical Impression   PTA pt living and working independently, had recently moved from [place] to [employer] for a job promotion. At time of eval, pt requires mod A to complete bed mobility and mod A +2 to complete sit <> stands. Pt on RA on arrival, easily desatting with mobility with stridor and raspy voice noted. 2L [place] used for mobility with VSS. HR up to 130s with transfers. Pt required increased time and education on ECS and allowing recovery time to maintain breath. Pt finished session up in recliner. Noted mild cognitive deficits, suspected from prolonged intubation. Recommend CIR to promote independent PLOF. Will continue to follow per POC listed below to progress BADLs.     Follow Up Recommendations  CIR    Equipment Recommendations  Other (comment)(defer)    Recommendations for Other Services       Precautions / Restrictions Precautions Precautions: Fall;Other (comment) Precaution Comments: watch O2 sats and HR during mobility.  Restrictions Weight Bearing Restrictions: No      Mobility Bed Mobility Overal bed mobility: Needs Assistance Bed Mobility: Supine to Sit     Supine to sit: Mod assist;HOB elevated     General bed mobility comments: Cues for hand placement on railing, extra time needed to complete task due to weakness and DOE 3/4.  Desaturated to 84% on RA, so placed 2 L O2 [place] and remained in the low 90s during mobility.   Transfers Overall transfer level: Needs assistance Equipment used: Rolling walker (2 wheeled) Transfers: Sit to/from [employer] Sit to Stand: Mod  assist;Max assist;From elevated surface Stand pivot transfers: Mod assist;From elevated surface       General transfer comment: Mod to max assist for first stand over shakey knees, better on second stand with RW, mod assist to transfer with RW to chair.  Unable to fit in [place] stedy standing frame (body habitus is too wide).     Balance Overall balance assessment: Needs assistance Sitting-balance support: Feet supported;Bilateral upper extremity supported Sitting balance-Leahy Scale: Fair Sitting balance - Comments: close supervision EOB, not challenged   Standing balance support: Bilateral upper extremity supported Standing balance-Leahy Scale: Zero Standing balance comment: up to zero due to max assist to stand EOB.                             ADL either performed or assessed with clinical judgement   ADL Overall ADL's : Needs assistance/impaired Eating/Feeding: Set up;Sitting   Grooming: Set up;Sitting   Upper Body Bathing: Minimal assistance;Sitting   Lower Body Bathing: Maximal assistance;Sit to/from stand;Sitting/lateral leans   Upper Body Dressing : Minimal assistance;Sitting   Lower Body Dressing: Maximal assistance;Sit to/from stand;Sitting/lateral leans   Toilet Transfer: Moderate assistance;+2 for physical assistance;+2 for safety/equipment;RW;Stand-pivot Toilet Transfer Details (indicate cue type and reason): simulated to chair, increased time and effort Toileting- Clothing Manipulation and Hygiene: Maximal assistance;Sit to/from stand       Functional mobility during ADLs: Moderate assistance;+2 for physical assistance;+2 for safety/equipment;Rolling walker  Vision Baseline Vision/History: Wears glasses Wears Glasses: At all times Patient Visual Report: No change from baseline       Perception     Praxis      Pertinent Vitals/Pain Pain Assessment: No/denies pain     Hand Dominance Left   Extremity/Trunk Assessment Upper Extremity  Assessment Upper Extremity Assessment: Generalized weakness   Lower Extremity Assessment Lower Extremity Assessment: Defer to PT evaluation   Cervical / Trunk Assessment Cervical / Trunk Assessment: Normal   Communication Communication Communication: Other (comment)(low raspy voice from prolonged intubation)   Cognition Arousal/Alertness: Awake/alert Behavior During Therapy: Anxious Overall Cognitive Status: Impaired/Different from baseline Area of Impairment: Problem solving;Orientation                 Orientation Level: Disoriented to;Time           Problem Solving: Slow processing General Comments: Pt mildly disoriented to time, was able to orient with increased time and cues. Overall slow to process   General Comments  HR up to the 130s during mobility, O2 sats dropped to 84% on RA and 2 L O2 [place] needed to maintain sats in the 90s. DOE 3/4    Exercises Exercises: General Lower Extremity;Other exercises General Exercises - Lower Extremity Long Arc Quad: AROM;Both;5 reps Other Exercises Other Exercises: IS x 5 reps, max observed volume was 500 mL.  cues for correct technique.    Shoulder Instructions      Home Living Family/patient expects to be discharged to:: Private residence Living Arrangements: Children;Other relatives(sister can stay with her after d/c; son is going to college soon) Available Help at Discharge: Family;Available PRN/intermittently Type of Home: Other(Comment)(townhome) Home Access: Level entry     Home Layout: Able to live on main level with bedroom/bathroom;Two level Alternate Level Stairs-Number of Steps: full flight, does not have to go upstairs   Bathroom Shower/Tub: Producer, television/film/video: Standard     Home Equipment: Hand held shower head          Prior Functioning/Environment Level of Independence: Independent        Comments: recent move from [place] to [place] for promotion. 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  OT Goal Formulation: With patient Time For Goal Achievement: 10/19/19 Potential to Achieve Goals: Good  OT Frequency: Min 2X/week   Barriers to D/C:            Co-evaluation PT/OT/SLP Co-Evaluation/Treatment: Yes Reason for Co-Treatment: Complexity of the patient's impairments (multi-system involvement);For patient/therapist safety;To address functional/ADL transfers PT goals addressed during session: Mobility/safety with mobility;Proper use of DME;Strengthening/ROM;Balance OT goals addressed during session: ADL's and self-care;Strengthening/ROM      AM-PAC OT "6 Clicks" Daily Activity     Outcome Measure Help from another person eating meals?: None Help from another person taking care of personal grooming?: A Little Help from another person toileting, which includes using toliet, bedpan, or urinal?: A Lot Help from another person bathing (including washing, rinsing, drying)?: A Lot Help from another person to put on and taking off regular upper body  clothing?: A Little Help from another person to put on and taking off regular lower body clothing?: A Lot 6 Click Score: 16   End of Session Equipment Utilized During Treatment: Rolling walker Nurse Communication: Mobility status  Activity Tolerance: Patient tolerated treatment well Patient left: in chair;with call bell/phone within reach  OT Visit Diagnosis: Other abnormalities of gait and  mobility (R26.89);Muscle weakness (generalized) (M62.81);Unsteadiness on feet (R26.81)                Time: 1358-1500 OT Time Calculation (min): 62 min Charges:  OT General Charges $OT Visit: 1 Visit OT Evaluation $OT Eval Moderate Complexity: 1 Mod OT Treatments $Self Care/Home Management : 8-22 mins  Zenovia Jarred, MSOT, OTR/L Acute Rehabilitation Services [facility] Office Number: [phone] Pager: [phone]  Zenovia Jarred 10/05/2019, 5:17 PM

## 2019-10-05 NOTE — Plan of Care (Signed)

## 2019-10-05 NOTE — Progress Notes (Signed)
Patient was transferred to 73m12 due to increased stridor and work of breathing. Transferred with rapid response after calling when patient dropped oxygenation and increased oxygen demands. PRN medications were given with no improvement. Transferred for higher level of care needed. Will transfer care at this time.

## 2019-10-05 NOTE — Evaluation (Signed)
Physical Therapy Evaluation Patient Details Name: Summer Guzman MRN: [id number] DOB: [birth year] Today's Date: 10/05/2019   History of Present Illness  54 y.o. female admitted on 09/28/2019 for SOB.  Found to be COVID (+) 09/19/19 dx with severe ARDS due to COVID 19 PNA.  Noted to be in A-fib with RVR.  Pt intubated on 09/24/19 and extubated  10/03/19.  Pt with significant PMH of asthma.   Clinical Impression  Pt is weak and deconditioned from prolonged illness.  She was on RA when we entered for PT/OT co session, but needed 2 L O2 [place] during mobility.  She is very weak over her feet and would benefit from some intensive post acute rehab before returning to her town home where she says her sister from [place] will come and stay with her until she recovers.   PT to follow acutely for deficits listed below.      Follow Up Recommendations CIR    Equipment Recommendations  Rolling walker with 5" wheels;3in1 (PT);Other (comment)(bariatric)    Recommendations for Other Services Rehab consult     Precautions / Restrictions Precautions Precautions: Fall;Other (comment) Precaution Comments: watch O2 sats and HR during mobility.  Restrictions Weight Bearing Restrictions: No      Mobility  Bed Mobility Overal bed mobility: Needs Assistance Bed Mobility: Supine to Sit     Supine to sit: Mod assist;HOB elevated     General bed mobility comments: Cues for hand placement on railing, extra time needed to complete task due to weakness and DOE 3/4.  Desaturated to 84% on RA, so placed 2 L O2 [place] and remained in the low 90s during mobility.   Transfers Overall transfer level: Needs assistance Equipment used: Rolling walker (2 wheeled) Transfers: Sit to/from [employer] Sit to Stand: Mod assist;Max assist;From elevated surface Stand pivot transfers: Mod assist;From elevated surface       General transfer comment: Mod to max assist for first stand over shakey knees, better on second  stand with RW, mod assist to transfer with RW to chair.  Unable to fit in [place] stedy standing frame (body habitus is too wide).          Balance Overall balance assessment: Needs assistance Sitting-balance support: Feet supported;Bilateral upper extremity supported Sitting balance-Leahy Scale: Fair Sitting balance - Comments: close supervision EOB, not challenged   Standing balance support: Bilateral upper extremity supported Standing balance-Leahy Scale: Zero Standing balance comment: up to zero due to max assist to stand EOB.                               Pertinent Vitals/Pain Pain Assessment: No/denies pain    Home Living Family/patient expects to be discharged to:: Private residence Living Arrangements: Children;Other relatives(sister can stay with her after d/c if needed; son is in college) Available Help at Discharge: Family;Available PRN/intermittently Type of Home: Other(Comment)(townhome) Home Access: Level entry     Home Layout: Able to live on main level with bedroom/bathroom;Two level Home Equipment: Hand held shower head      Prior Function Level of Independence: Independent         Comments: recent move from [place] to [place] for promotion. 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[employer]     Hand Dominance   Dominant Hand: Left    Extremity/Trunk Assessment   Upper Extremity Assessment Upper Extremity Assessment: Defer to OT evaluation    Lower Extremity Assessment Lower Extremity Assessment: Generalized  weakness(3-/5 throughout, equal bil)    Cervical / Trunk Assessment Cervical / Trunk Assessment: Normal  Communication   Communication: Other (comment)(low raspy voice from long intubation)  Cognition Arousal/Alertness: Awake/alert Behavior During Therapy: Anxious Overall Cognitive Status: Impaired/Different from baseline Area of Impairment: Problem solving;Orientation                 Orientation Level: Disoriented to;Time            Problem Solving: Slow processing General Comments: Pt a bit slow to process, vs trying to catch her breath to respond.  Just a little disoriented to time, to be expected with long intubation.       General Comments General comments (skin integrity, edema, etc.): HR up to the 130s during mobility, O2 sats dropped to 84% on RA and 2 L O2 [place] needed to maintain sats in the 90s. DOE 3/4    Exercises General Exercises - Lower Extremity Long Arc Quad: AROM;Both;5 reps Other Exercises Other Exercises: IS x 5 reps, max observed volume was 500 mL.  cues for correct technique.    Assessment/Plan    PT Assessment Patient needs continued PT services  PT Problem List Decreased strength;Decreased activity tolerance;Decreased balance;Decreased mobility;Decreased knowledge of use of DME;Decreased knowledge of precautions;Decreased cognition;Cardiopulmonary status limiting activity;Obesity       PT Treatment Interventions DME instruction;Gait training;Stair training;Therapeutic activities;Functional mobility training;Therapeutic exercise;Balance training;Cognitive remediation;Patient/family education    PT Goals (Current goals can be found in the Care Plan section)  Acute Rehab PT Goals Patient Stated Goal: to go back to [place] where she lived before.  PT Goal Formulation: With patient Time For Goal Achievement: 10/19/19 Potential to Achieve Goals: Good    Frequency Min 3X/week        Co-evaluation PT/OT/SLP Co-Evaluation/Treatment: Yes Reason for Co-Treatment: Complexity of the patient's impairments (multi-system involvement);For patient/therapist safety;To address functional/ADL transfers PT goals addressed during session: Mobility/safety with mobility;Proper use of DME;Strengthening/ROM;Balance         AM-PAC PT "6 Clicks" Mobility  Outcome Measure Help needed turning from your back to your side while in a flat bed without using bedrails?: A Lot Help needed moving from lying on your back  to sitting on the side of a flat bed without using bedrails?: A Lot Help needed moving to and from a bed to a chair (including a wheelchair)?: A Lot Help needed standing up from a chair using your arms (e.g., wheelchair or bedside chair)?: Total Help needed to walk in hospital room?: Total Help needed climbing 3-5 steps with a railing? : Total 6 Click Score: 9    End of Session Equipment Utilized During Treatment: Oxygen Activity Tolerance: Patient limited by fatigue;Other (comment)(limited by DOE) Patient left: in chair;with call bell/phone within reach;with chair alarm set Nurse Communication: Mobility status PT Visit Diagnosis: Muscle weakness (generalized) (M62.81);Difficulty in walking, not elsewhere classified (R26.2)    Time: 1358-1500 PT Time Calculation (min) (ACUTE ONLY): 62 min   Charges:         Corinna Capra, PT, DPT  Acute Rehabilitation [phone] pager #[phone] office     PT Evaluation $PT Eval Moderate Complexity: 1 Mod PT Treatments $Therapeutic Activity: 8-22 mins      10/05/2019, 3:26 PM

## 2019-10-05 NOTE — Progress Notes (Signed)
Patient belongings returned to her. 3 cell phones and a wallet. Placed signed copy in chart.

## 2019-10-05 NOTE — Evaluation (Signed)
Clinical/Bedside Swallow Evaluation Patient Details  Name: Makisha Marrin MRN: [id number] Date of Birth: Jul 17, 1965  Today's Date: 10/05/2019 Time: SLP Start Time (ACUTE ONLY): 0900 SLP Stop Time (ACUTE ONLY): 0930 SLP Time Calculation (min) (ACUTE ONLY): 30 min  Past Medical History:  Past Medical History:  Diagnosis Date  . Asthma    Past Surgical History:  Past Surgical History:  Procedure Laterality Date  . CESAREAN SECTION     HPI:  This is a pleasant 54 y/o female who tested positive for COVID on 3/17.  She believed she became infected after exposure to sick family.  She presented to [facility] on 3/21 with dyspnea, cough, found to be very hypoxemic.  Received Actemra, remdesivir, decadron.  Her respiratory rate continued to climb after admission and her O2 saturation dropped.  She complained of feeling more dyspneic.  After discussion with the patient it was decided to place her on mechanical ventilation. intubated 3/22-4/1. striderous   Assessment / Plan / Recommendation Clinical Impression  Pt demonstrates  signs of mild dysphagia following prolonged intubation. She is hoarse, but has a strong cough ability. Typically pts takes very small sips and even tiwth max encouragement pt would not take consecutive sips of water. She could not complete the 3 oz water challenge. However, with larger volume intake pt did have 1-2 instances of slight coughing. This did not persist and POs were otherwise tolerated well. Expect some mild dysphagia resulting from prolonged intubation but would allow small sips and soft solids as pt continues to rapidly recover. SLP will f/u for tolerance. RN aware.  SLP Visit Diagnosis: Dysphagia, oropharyngeal phase (R13.12)    Aspiration Risk  Mild aspiration risk    Diet Recommendation Dysphagia 3 (Mech soft);Thin liquid   Liquid Administration via: Cup;Straw Medication Administration: Whole meds with puree Supervision: Staff to assist with self  feeding Compensations: Slow rate;Small sips/bites    Other  Recommendations Oral Care Recommendations: Oral care BID   Follow up Recommendations None      Frequency and Duration min 2x/week  2 weeks       Prognosis Prognosis for Safe Diet Advancement: Good      Swallow Study   General HPI: This is a pleasant 54 y/o female who tested positive for COVID on 3/17.  She believed she became infected after exposure to sick family.  She presented to [facility] on 3/21 with dyspnea, cough, found to be very hypoxemic.  Received Actemra, remdesivir, decadron.  Her respiratory rate continued to climb after admission and her O2 saturation dropped.  She complained of feeling more dyspneic.  After discussion with the patient it was decided to place her on mechanical ventilation. intubated 3/22-4/1. striderous Type of Study: Bedside Swallow Evaluation Diet Prior to this Study: NPO Temperature Spikes Noted: N/A Respiratory Status: Room air History of Recent Intubation: Yes Length of Intubations (days): 10 days Date extubated: 10/04/19 Behavior/Cognition: Alert;Cooperative;Pleasant mood Oral Cavity Assessment: Within Functional Limits Oral Care Completed by SLP: No Oral Cavity - Dentition: Adequate natural dentition Vision: Functional for self-feeding Self-Feeding Abilities: Able to feed self Patient Positioning: Upright in bed Baseline Vocal Quality: Hoarse Volitional Cough: Strong Volitional Swallow: Able to elicit    Oral/Motor/Sensory Function     Ice Chips Ice chips: Impaired Pharyngeal Phase Impairments: Cough - Immediate   Thin Liquid Thin Liquid: Impaired Presentation: Straw;Cup;Spoon Pharyngeal  Phase Impairments: Cough - Immediate    Nectar Thick Nectar Thick Liquid: Not tested   Honey Thick Honey Thick Liquid: Not tested  Puree Puree: Within functional limits   Solid     Solid: Within functional limits     Herbie Baltimore, MA [place] Pager  [phone] Office [phone]  Lynann Beaver 10/05/2019,11:17 AM

## 2019-10-05 NOTE — Progress Notes (Signed)
   NAME:  Summer Guzman, MRN:  [id number], DOB:  1966-02-14, LOS: 12 ADMISSION DATE:  09/27/2019, CONSULTATION DATE:  3/22 REFERRING MD: Butler Denmark, CHIEF COMPLAINT:  Dyspnea   Brief History   54 y/o female admitted to [facility] with ARDS due to COVID pneumonia on 3/21.  Intubated 3/22.  History of present illness   This is a pleasant 54 y/o female who tested positive for COVID on 3/17.  She believed she became infected after exposure to sick family.  She presented to [facility] on 3/21 with dyspnea, cough, found to be very hypoxemic.  Received Actemra, remdesivir, decadron.  Her respiratory rate continued to climb after admission and her O2 saturation dropped.  She complained of feeling more dyspneic.  After discussion with the patient it was decided to place her on mechanical ventilation. Developed atrial fib with RVR.   Past Medical History  asthma  Significant Hospital Events   3/21 admission 3/22 intubation, transfer from [facility] to [facility] 3/27 hypoxia improving 3/31 extubated, issues with stridor improved with dexamethasone, racemic epinephrine, and BIPAP  Consults:  PCCM  Procedures:  3/22 ETT >  3/22 L IJ CVL >  3/22 radial A-line> removed  Significant Diagnostic Tests:  3/21 TTE> LVEF 70-75%, RV function normal, valves OK  Micro Data:  3/17 SARS COV 2 > positive outside [place] 3/21 blood > neg 3/22 sputum> NG  3/27 resp: ngtd  Antimicrobials/COVID Rx:  3/21 cefepime x1 3/21 tocilizumab x1 3/21 remdesivir > 3/25 3/21 decadron > 3/30  Interim history/subjective:  No events Stridor resolved Awaiting SLP eval Central line oozing  Objective   Blood pressure 108/70, pulse 98, temperature 98.9 F (37.2 C), temperature source Oral, resp. rate 17, height 5\' 6"  (1.676 m), weight (!) 152.5 kg, last menstrual period 09/21/2019, SpO2 98 %.        Intake/Output Summary (Last 24 hours) at 10/05/2019 0851 Last data filed at 10/05/2019 0500 Gross per 24 hour  Intake 120 ml  Output 1600 ml    Net -1480 ml   Filed Weights   10/03/19 0500 10/04/19 0414 10/05/19 0407  Weight: (!) 149.2 kg (!) 153.4 kg (!) 152.5 kg    Examination:  GEN: middle aged woman in NAD HEENT: MM dry, malampatti 3, trachea midline, LIJ CVL oozing CV: RRR, ext warm PULM: Scattered rhonci, no accessory muscle use, no stridor GI: Soft, hypoactive BS EXT: no edema appreciated NEURO: Moves all 4 ext to command, raspy voice PSYCH: RASS 0 SKIN: No rashes   Resolved Hospital Problem list   Afib/RVR reactive, self-resolved Post extubation stridor  Assessment & Plan:  Severe ARDS due to COVID 19 pneumonia: - Improving, work on mobility, wean O2 as able, encourage IS  Hx of Asthma - albuterol prn prior to admission - Start breo, combivent PRN  Hx DM- hypoglycemic as now NPO and off steroids - Watch sugars, orange juice PRN  Dysphagia- awaiting SLP input, tolerating sips  Hypokalemia- replete  Transaminitis- question delayed remdesivir effect - trend for now, avoid hepatotoxic agents, APAP fine up to 2g/day  Bleeding from CVC site- remove, apply pressure dressing, monotir, drop lovenox from 0.5mg /kg BID to 40mg  BID  Dispo- obtain PIV, remove central line, okay for stepdown tele, appreciate TRH taking over care  12/05/19 MD PCCM

## 2019-10-05 NOTE — Progress Notes (Signed)
Patient transferred to 5W from [place]. A&Ox3 at time of transfer. On RA. Skin assessed with Ryta, RN. No signs of skin breakdown noted. Spoke with sister, Hector Shade, and updated on plan of care. Patient instructed on use of call bell and all items in reach. Will continue to monitor and treat per MD orders.

## 2019-10-05 NOTE — Progress Notes (Signed)
Patient's belongings from ED returned to patient per sister (Suzette)'s request.

## 2019-10-06 ENCOUNTER — Inpatient Hospital Stay (HOSPITAL_COMMUNITY): Payer: No Typology Code available for payment source

## 2019-10-06 LAB — POCT I-STAT 7, (LYTES, BLD GAS, ICA,H+H)
Acid-Base Excess: 5 mmol/L — ABNORMAL HIGH (ref 0.0–2.0)
Bicarbonate: 33.1 mmol/L — ABNORMAL HIGH (ref 20.0–28.0)
Calcium, Ion: 1.27 mmol/L (ref 1.15–1.40)
HCT: 41 % (ref 36.0–46.0)
Hemoglobin: 13.9 g/dL (ref 12.0–15.0)
O2 Saturation: 100 %
Potassium: 4.1 mmol/L (ref 3.5–5.1)
Sodium: 139 mmol/L (ref 135–145)
TCO2: 35 mmol/L — ABNORMAL HIGH (ref 22–32)
pCO2 arterial: 65.9 mmHg (ref 32.0–48.0)
pH, Arterial: 7.309 — ABNORMAL LOW (ref 7.350–7.450)
pO2, Arterial: 391 mmHg — ABNORMAL HIGH (ref 83.0–108.0)

## 2019-10-06 LAB — HEPATIC FUNCTION PANEL
ALT: 239 U/L — ABNORMAL HIGH (ref 0–44)
AST: 103 U/L — ABNORMAL HIGH (ref 15–41)
Albumin: 2.8 g/dL — ABNORMAL LOW (ref 3.5–5.0)
Alkaline Phosphatase: 47 U/L (ref 38–126)
Bilirubin, Direct: 0.2 mg/dL (ref 0.0–0.2)
Indirect Bilirubin: 0.8 mg/dL (ref 0.3–0.9)
Total Bilirubin: 1 mg/dL (ref 0.3–1.2)
Total Protein: 5.6 g/dL — ABNORMAL LOW (ref 6.5–8.1)

## 2019-10-06 LAB — GLUCOSE, CAPILLARY
Glucose-Capillary: 103 mg/dL — ABNORMAL HIGH (ref 70–99)
Glucose-Capillary: 103 mg/dL — ABNORMAL HIGH (ref 70–99)
Glucose-Capillary: 104 mg/dL — ABNORMAL HIGH (ref 70–99)
Glucose-Capillary: 132 mg/dL — ABNORMAL HIGH (ref 70–99)
Glucose-Capillary: 194 mg/dL — ABNORMAL HIGH (ref 70–99)
Glucose-Capillary: 255 mg/dL — ABNORMAL HIGH (ref 70–99)
Glucose-Capillary: 92 mg/dL (ref 70–99)

## 2019-10-06 MED ORDER — LORAZEPAM 2 MG/ML IJ SOLN
1.0000 mg | INTRAMUSCULAR | Status: DC | PRN
  Administered 2019-10-06 – 2019-10-09 (×6): 1 mg via INTRAVENOUS
  Filled 2019-10-06 (×6): qty 1

## 2019-10-06 MED ORDER — PANTOPRAZOLE SODIUM 40 MG IV SOLR
40.0000 mg | INTRAVENOUS | Status: DC
  Administered 2019-10-06: 09:00:00 40 mg via INTRAVENOUS
  Filled 2019-10-06: qty 40

## 2019-10-06 MED ORDER — FUROSEMIDE 10 MG/ML IJ SOLN
40.0000 mg | Freq: Once | INTRAMUSCULAR | Status: AC
  Administered 2019-10-06: 40 mg via INTRAVENOUS
  Filled 2019-10-06: qty 4

## 2019-10-06 MED ORDER — RACEPINEPHRINE HCL 2.25 % IN NEBU
0.5000 mL | INHALATION_SOLUTION | RESPIRATORY_TRACT | Status: DC | PRN
  Administered 2019-10-06 – 2019-10-09 (×6): 0.5 mL via RESPIRATORY_TRACT
  Filled 2019-10-06 (×10): qty 0.5

## 2019-10-06 MED ORDER — DEXMEDETOMIDINE HCL IN NACL 400 MCG/100ML IV SOLN
0.4000 ug/kg/h | INTRAVENOUS | Status: DC
  Administered 2019-10-06: 0.4 ug/kg/h via INTRAVENOUS
  Administered 2019-10-06 (×2): 1 ug/kg/h via INTRAVENOUS
  Administered 2019-10-07: 0.8 ug/kg/h via INTRAVENOUS
  Administered 2019-10-07: 0.4 ug/kg/h via INTRAVENOUS
  Administered 2019-10-07: 0.8 ug/kg/h via INTRAVENOUS
  Filled 2019-10-06 (×6): qty 100

## 2019-10-06 MED ORDER — FLUOXETINE HCL 20 MG PO CAPS
20.0000 mg | ORAL_CAPSULE | Freq: Every day | ORAL | Status: DC
  Administered 2019-10-06 – 2019-10-09 (×4): 20 mg via ORAL
  Filled 2019-10-06 (×6): qty 1

## 2019-10-06 MED ORDER — DEXAMETHASONE SODIUM PHOSPHATE 4 MG/ML IJ SOLN
4.0000 mg | Freq: Two times a day (BID) | INTRAMUSCULAR | Status: DC
  Administered 2019-10-06 – 2019-10-09 (×8): 4 mg via INTRAVENOUS
  Filled 2019-10-06 (×8): qty 1

## 2019-10-06 NOTE — Progress Notes (Signed)
Video call with family. Patient alert and responsive. Voice is hoarse.

## 2019-10-06 NOTE — Progress Notes (Signed)
eLink Physician-Brief Progress Note Patient Name: Summer Guzman DOB: 1965/07/18 MRN: [id number]   Date of Service  10/06/2019  HPI/Events of Note  Notified that patient is very anxious while on BIPAP, requests some medication to help her relax. Was brought back to ICU with stridor and has improved on BIPAP. On 35% fio2, her o2 sat is 100. She does not have respiratory distress on camera, is awake and alert but says she is anxious. HR Is 110. Not on any infusions. RN notes resp status has improved in ICU  eICU Interventions  Add precedex to help her calm down Discussed with her over camera, and discussed with RN      Intervention Category Major Interventions: Respiratory failure - evaluation and management  Veronique Warga G Nefi Musich 10/06/2019, 2:19 AM

## 2019-10-06 NOTE — Progress Notes (Signed)
Assisted tele visit to patient with her sister.  Summer Guzman, Loni Beckwith, RN

## 2019-10-06 NOTE — Progress Notes (Addendum)
PCCM Interval progress note:  PCCM called to re-evaluate patient due to increased stridor and work of breathing.  Per report had been wheezing all day after transfer out of the ICU, but then dropped her oxygen saturations on room and was placed on nasal cannula.  Post extubation on 3/31, pt was stridorous and improved with   racemic epinephrine and bipap.   On evaluation, pt was tachypneic with severe stridor and wheezing throughout, though maintain O2 sats >92%.  Pt was transferred to the ICU, given continued albuterol neb, solumedrol 125mg , racemic epinephrine, magnesium 2g and placed on Bipap.   Pt is at high risk of re-intubation, but is slightly improved with the above, continue to monitor overnight.    Jeweldean Drohan, PA-C   CRITICAL CARE Performed by: Darcella Gasman Jenissa Tyrell   Total critical care time: 40 minutes minutes  Critical care time was exclusive of separately billable procedures and treating other patients.  Critical care was necessary to treat or prevent imminent or life-threatening deterioration.  Critical care was time spent personally by me on the following activities: development of treatment plan with patient and/or surrogate as well as nursing, discussions with consultants, evaluation of patient's response to treatment, examination of patient, obtaining history from patient or surrogate, ordering and performing treatments and interventions, ordering and review of laboratory studies, ordering and review of radiographic studies, pulse oximetry and re-evaluation of patient's condition.

## 2019-10-06 NOTE — Significant Event (Signed)
Rapid Response Event Note  Overview:Called d/t increased stridor, respiratory distress, and decreased SpO2. Per RN, pt has had stridor t/o shift, however,  around 2300, stridor seemed to worsen, even after combivent inhaler given. SpO2 also dropped to 70% on RA so pt placed on 2L [place] with SpO2 increasing to 90%,  and RRT was called.  Initial Focused Assessment: Pt sitting up in bed in respiratory distress, stridor heard from doorway. Lungs with hardly any air movement. HR-108, RR-30s, SpO2-90% on 2L [place]. Pt alert and oriented. Skin warm and dry.   Interventions: PCCM Gleason, PA called at 2314 and pt moved emergently to 3M12, placed on bipap, PIVs inserted(midline not working), ABG drawn, 2g Mg IV, solu-medrol, Racemic EPI given and cont alb neb started.   Event Summary: Called: 2305 Arrived: 2308 [id number]        Terrilyn Saver

## 2019-10-06 NOTE — Progress Notes (Signed)
NAME:  Summer Guzman, MRN:  [id number], DOB:  02/12/1966, LOS: 13 ADMISSION DATE:  09/22/2019, CONSULTATION DATE:  3/22 REFERRING MD: Butler Denmark, CHIEF COMPLAINT:  Dyspnea   Brief History   54 y/o female admitted to [facility] with ARDS due to COVID pneumonia on 3/21.  Intubated 3/22.  History of present illness   This is a pleasant 54 y/o female who tested positive for COVID on 3/17.  She believed she became infected after exposure to sick family.  She presented to [facility] on 3/21 with dyspnea, cough, found to be very hypoxemic.  Received Actemra, remdesivir, decadron.  Her respiratory rate continued to climb after admission and her O2 saturation dropped.  She complained of feeling more dyspneic.  After discussion with the patient it was decided to place her on mechanical ventilation. Developed atrial fib with RVR.   Past Medical History  asthma  Significant Hospital Events   3/21 admission 3/22 intubation, transfer from [facility] to [facility] 3/27 hypoxia improving 3/31 extubated, issues with stridor improved with dexamethasone, racemic epinephrine, and BIPAP 4/2-4/3, transferred to progressive, had another episode of stridor and sent back to unit  Consults:  PCCM  Procedures:  3/22 ETT >  3/22 L IJ CVL >  3/22 radial A-line> removed  Significant Diagnostic Tests:  3/21 TTE> LVEF 70-75%, RV function normal, valves OK  Micro Data:  3/17 SARS COV 2 > positive outside [place] 3/21 blood > neg 3/22 sputum> NG  3/27 resp: ngtd  Antimicrobials/COVID Rx:  3/21 cefepime x1 3/21 tocilizumab x1 3/21 remdesivir > 3/25 3/21 decadron > 3/30  Interim history/subjective:  Events last night noted Patient now on precedex, BIPAP, pretty sedated but will follow commands  Objective   Blood pressure 111/81, pulse 96, temperature 98 F (36.7 C), temperature source Axillary, resp. rate (!) 22, height 5\' 6"  (1.676 m), weight (!) 148.7 kg, last menstrual period 09/21/2019, SpO2 96 %.    Vent Mode: BIPAP FiO2  (%):  [35 %] 35 % Set Rate:  [10 bmp] 10 bmp PEEP:  [4 cmH20] 4 cmH20   Intake/Output Summary (Last 24 hours) at 10/06/2019 0735 Last data filed at 10/06/2019 0600 Gross per 24 hour  Intake 470 ml  Output 700 ml  Net -230 ml   Filed Weights   10/04/19 0414 10/05/19 0407 10/06/19 0359  Weight: (!) 153.4 kg (!) 152.5 kg (!) 148.7 kg    Examination:  GEN: middle aged woman in BIPAP HEENT: MM dry, malampatti 3, trachea midline, LIJ site bleeding minimal CV: RRR, ext warm PULM: has focal areas of stridor bilaterally, minimal wheezing GI: Soft, hypoactive BS EXT: no edema appreciated NEURO: Moves all 4 ext to command PSYCH: RASS 0 SKIN: No rashes   Resolved Hospital Problem list   Afib/RVR reactive, self-resolved Transaminitis- improving  Assessment & Plan:  Severe ARDS due to COVID 19 pneumonia: - Improving, work on mobility, wean O2 as able, encourage IS  Stridor- intermittent, suspicion here is VCD more than anything leading to de-recruitment.  Will try to reduce laryngeal irritants (GERD, rhinitis/sinusitis, anxiety) - Start PPI, prozac - Lasix x 1 - Continue singulair, breo - Racemic epi PRN - Could consider benzodiapines - Wean from BIPAP and precedex  Hx of Asthma - albuterol prn prior to admission - breo, combivent PRN  Hx DM-  - Improved with PO - May need to add mealtime coverage  Dysphagia- cleared for dysphagia 3  Bleeding from CVC site- improved, dressing changes PRN  Dispo- ICU for now  pending improvement in stridor  32 minutes CC time  Erskine Emery MD PCCM

## 2019-10-06 NOTE — Progress Notes (Signed)
Rehab Admissions Coordinator Note:  Patient was screened by Clois Dupes for appropriateness for an Inpatient Acute Rehab Consult. Noted patient transferred out of 26M to 5w and then returned to 26M due to stridor. We will follow her progress before proceeding with a possible CIR consult.   Ottie Glazier RN MSN   10/06/2019, 10:27 AM  I can be reached at [phone].

## 2019-10-07 DIAGNOSIS — R061 Stridor: Secondary | ICD-10-CM

## 2019-10-07 LAB — CBC
HCT: 35.3 % — ABNORMAL LOW (ref 36.0–46.0)
Hemoglobin: 10.8 g/dL — ABNORMAL LOW (ref 12.0–15.0)
MCH: 21.4 pg — ABNORMAL LOW (ref 26.0–34.0)
MCHC: 30.6 g/dL (ref 30.0–36.0)
MCV: 69.9 fL — ABNORMAL LOW (ref 80.0–100.0)
Platelets: 191 10*3/uL (ref 150–400)
RBC: 5.05 MIL/uL (ref 3.87–5.11)
RDW: 18.7 % — ABNORMAL HIGH (ref 11.5–15.5)
WBC: 27.9 10*3/uL — ABNORMAL HIGH (ref 4.0–10.5)
nRBC: 0 % (ref 0.0–0.2)

## 2019-10-07 LAB — BASIC METABOLIC PANEL
Anion gap: 9 (ref 5–15)
BUN: 16 mg/dL (ref 6–20)
CO2: 31 mmol/L (ref 22–32)
Calcium: 9 mg/dL (ref 8.9–10.3)
Chloride: 101 mmol/L (ref 98–111)
Creatinine, Ser: 0.66 mg/dL (ref 0.44–1.00)
GFR calc Af Amer: >60 mL/min (ref >60)
GFR calc non Af Amer: >60 mL/min (ref >60)
Glucose, Bld: 156 mg/dL — ABNORMAL HIGH (ref 70–99)
Potassium: 4.9 mmol/L (ref 3.5–5.1)
Sodium: 141 mmol/L (ref 135–145)

## 2019-10-07 LAB — GLUCOSE, CAPILLARY
Glucose-Capillary: 108 mg/dL — ABNORMAL HIGH (ref 70–99)
Glucose-Capillary: 115 mg/dL — ABNORMAL HIGH (ref 70–99)
Glucose-Capillary: 122 mg/dL — ABNORMAL HIGH (ref 70–99)
Glucose-Capillary: 143 mg/dL — ABNORMAL HIGH (ref 70–99)
Glucose-Capillary: 170 mg/dL — ABNORMAL HIGH (ref 70–99)
Glucose-Capillary: 94 mg/dL (ref 70–99)

## 2019-10-07 LAB — MAGNESIUM: Magnesium: 2.5 mg/dL — ABNORMAL HIGH (ref 1.7–2.4)

## 2019-10-07 MED ORDER — TEMAZEPAM 15 MG PO CAPS
30.0000 mg | ORAL_CAPSULE | Freq: Every day | ORAL | Status: DC
  Administered 2019-10-07 – 2019-10-08 (×2): 30 mg via ORAL
  Filled 2019-10-07 (×2): qty 2

## 2019-10-07 MED ORDER — CHLORHEXIDINE GLUCONATE CLOTH 2 % EX PADS
6.0000 | MEDICATED_PAD | Freq: Every day | CUTANEOUS | Status: DC
  Administered 2019-10-07 – 2019-10-09 (×3): 6 via TOPICAL

## 2019-10-07 MED ORDER — IPRATROPIUM-ALBUTEROL 0.5-2.5 (3) MG/3ML IN SOLN
3.0000 mL | Freq: Four times a day (QID) | RESPIRATORY_TRACT | Status: DC | PRN
  Administered 2019-10-07 – 2019-10-09 (×2): 3 mL via RESPIRATORY_TRACT
  Filled 2019-10-07: qty 3

## 2019-10-07 MED ORDER — IPRATROPIUM-ALBUTEROL 0.5-2.5 (3) MG/3ML IN SOLN
RESPIRATORY_TRACT | Status: AC
  Filled 2019-10-07: qty 3

## 2019-10-07 MED ORDER — PANTOPRAZOLE SODIUM 40 MG PO TBEC
40.0000 mg | DELAYED_RELEASE_TABLET | Freq: Every day | ORAL | Status: DC
  Administered 2019-10-07 – 2019-10-09 (×3): 40 mg via ORAL
  Filled 2019-10-07 (×3): qty 1

## 2019-10-07 NOTE — Progress Notes (Signed)
Spoke with patients sister and provided updates via phone. Will call back in the morning.

## 2019-10-07 NOTE — Progress Notes (Signed)
NAME:  Summer Guzman, MRN:  [id number], DOB:  1966/05/04, LOS: 14 ADMISSION DATE:  09/30/2019, CONSULTATION DATE:  3/22 REFERRING MD: Butler Denmark, CHIEF COMPLAINT:  Dyspnea   Brief History   54 y/o female admitted to [facility] with ARDS due to COVID pneumonia on 3/21.  Intubated 3/22.  History of present illness   This is a pleasant 54 y/o female who tested positive for COVID on 3/17.  She believed she became infected after exposure to sick family.  She presented to [facility] on 3/21 with dyspnea, cough, found to be very hypoxemic.  Received Actemra, remdesivir, decadron.  Her respiratory rate continued to climb after admission and her O2 saturation dropped.  She complained of feeling more dyspneic.  After discussion with the patient it was decided to place her on mechanical ventilation. Developed atrial fib with RVR.   Past Medical History  asthma  Significant Hospital Events   3/21 admission 3/22 intubation, transfer from [facility] to [facility] 3/27 hypoxia improving 3/31 extubated, issues with stridor improved with dexamethasone, racemic epinephrine, and BIPAP 4/2-4/3, transferred to progressive, had another episode of stridor and sent back to unit 4/3-4/4 same episode  Consults:  PCCM  Procedures:  3/22 ETT >  3/22 L IJ CVL >  3/22 radial A-line> removed  Significant Diagnostic Tests:  3/21 TTE> LVEF 70-75%, RV function normal, valves OK  Micro Data:  3/17 SARS COV 2 > positive outside [place] 3/21 blood > neg 3/22 sputum> NG  3/27 resp: ngtd  Antimicrobials/COVID Rx:  3/21 cefepime x1 3/21 tocilizumab x1 3/21 remdesivir > 3/25 3/21 decadron > 3/30  Interim history/subjective:  Again worsening stridor as fell asleep. Refused BIPAP initially then placed on it with precedex.  Objective   Blood pressure 105/71, pulse 83, temperature 99.8 F (37.7 C), temperature source Oral, resp. rate (!) 21, height 5\' 6"  (1.676 m), weight (!) 148.7 kg, last menstrual period 09/21/2019, SpO2 94 %.      Vent Mode: BIPAP FiO2 (%):  [35 %-36 %] 35 % Set Rate:  [8 bmp] 8 bmp PEEP:  [4 cmH20-5 cmH20] 5 cmH20   Intake/Output Summary (Last 24 hours) at 10/07/2019 0714 Last data filed at 10/07/2019 0600 Gross per 24 hour  Intake 1014.37 ml  Output 2650 ml  Net -1635.63 ml   Filed Weights   10/05/19 0407 10/06/19 0359 10/07/19 0423  Weight: (!) 152.5 kg (!) 148.7 kg (!) 148.7 kg    Examination:  GEN: middle aged woman in BIPAP HEENT: LIJ site bleeding resolved, trachea midline, BIPAP in place with good seal CV: RRR, ext warm PULM: has focal areas of stridor bilaterally with inspiration at level of larynx, minimal wheezing GI: Soft, hypoactive BS EXT: no edema appreciated NEURO: Moves all 4 ext to command PSYCH: RASS 0 SKIN: No rashes   Resolved Hospital Problem list   Afib/RVR reactive, self-resolved Transaminitis- improving Bleeding from CVC site  Assessment & Plan:  Severe ARDS due to COVID 19 pneumonia: - Improving, work on mobility, wean O2 as able, encourage IS, progressive mobility  Stridor- 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  Worsened with loss of diaphragm excursion at night leading to respiratory distress and de-recruitment. - PPI, singulair, steroid trial - qHS restoril to make BIPAP tolerance better, try to avoid precedex - Would have ENT eval in AM to confirm diagnosis and degree of injury  Hx of Asthma - albuterol prn prior to admission - breo, combivent PRN  Hx DM-  - SSI, sugars have  been fine  Dysphagia- cleared for dysphagia 3  Dispo- ICU for now pending precedex liberation and consistent BIPAP use  33 minutes CC time  Erskine Emery MD PCCM

## 2019-10-07 NOTE — Progress Notes (Signed)
Spoke w/ pts sister to provide updates and set up e-link visit.

## 2019-10-08 ENCOUNTER — Inpatient Hospital Stay (HOSPITAL_COMMUNITY): Payer: No Typology Code available for payment source

## 2019-10-08 LAB — GLUCOSE, CAPILLARY
Glucose-Capillary: 141 mg/dL — ABNORMAL HIGH (ref 70–99)
Glucose-Capillary: 100 mg/dL — ABNORMAL HIGH (ref 70–99)
Glucose-Capillary: 105 mg/dL — ABNORMAL HIGH (ref 70–99)
Glucose-Capillary: 143 mg/dL — ABNORMAL HIGH (ref 70–99)
Glucose-Capillary: 84 mg/dL (ref 70–99)
Glucose-Capillary: 104 mg/dL — ABNORMAL HIGH (ref 70–99)

## 2019-10-08 LAB — BASIC METABOLIC PANEL
Anion gap: 12 (ref 5–15)
BUN: 16 mg/dL (ref 6–20)
CO2: 27 mmol/L (ref 22–32)
Calcium: 9.1 mg/dL (ref 8.9–10.3)
Chloride: 102 mmol/L (ref 98–111)
Creatinine, Ser: 0.58 mg/dL (ref 0.44–1.00)
GFR calc Af Amer: >60 mL/min (ref >60)
GFR calc non Af Amer: >60 mL/min (ref >60)
Glucose, Bld: 133 mg/dL — ABNORMAL HIGH (ref 70–99)
Potassium: 4.3 mmol/L (ref 3.5–5.1)
Sodium: 141 mmol/L (ref 135–145)

## 2019-10-08 LAB — C-REACTIVE PROTEIN

## 2019-10-08 LAB — MAGNESIUM: Magnesium: 2.5 mg/dL — ABNORMAL HIGH (ref 1.7–2.4)

## 2019-10-08 NOTE — Progress Notes (Signed)
RT placed patient on BIPAP. Patient tolerating well at this time. RT will monitor as needed. 

## 2019-10-08 NOTE — Progress Notes (Signed)
   NAME:  Summer Guzman, MRN:  [id number], DOB:  11-Oct-1965, LOS: 15 ADMISSION DATE:  Oct 10, 2019, CONSULTATION DATE:  3/22 REFERRING MD: Butler Denmark, CHIEF COMPLAINT:  Dyspnea   Brief History   54 y/o female admitted to [facility] with ARDS due to COVID pneumonia on 3/21.  Intubated 3/22.  Initial positive test 3/17 received Actemra, remdesivir, decadron.  Developed atrial fib with RVR.  She was extubated 3/31 but had persistent stridor  Past Medical History  asthma  Significant Hospital Events   3/21 admission 3/22 intubation, transfer from [facility] to [facility] 3/27 hypoxia improving 3/31 extubated, issues with stridor improved with dexamethasone, racemic epinephrine, and BIPAP 4/2-4/3, transferred to progressive, had another episode of stridor and sent back to unit 4/3-4/4 same episode  Consults:  PCCM  Procedures:  3/22 ETT > 3/31 3/22 L IJ CVL > 3/31 3/22 radial A-line> removed  Significant Diagnostic Tests:  3/21 TTE> LVEF 70-75%, RV function normal, valves OK  Micro Data:  3/17 SARS COV 2 > positive outside [place] 3/21 blood > neg 3/22 sputum> NG  3/27 resp: ngtd  Antimicrobials/COVID Rx:  3/21 cefepime x1 3/21 tocilizumab x1 3/21 remdesivir > 3/25 3/21 decadron > 3/30  Interim history/subjective:   Has been off Precedex since 4/4 7 AM Used BiPAP overnight now back on nasal cannula  Objective   Blood pressure 135/85, pulse 94, temperature 99.3 F (37.4 C), temperature source Oral, resp. rate (!) 24, height 5\' 6"  (1.676 m), weight (!) 148.7 kg, last menstrual period 09/21/2019, SpO2 98 %.    Vent Mode: BIPAP FiO2 (%):  [35 %-100 %] 100 % Set Rate:  [8 bmp] 8 bmp PEEP:  [5 cmH20] 5 cmH20   Intake/Output Summary (Last 24 hours) at 10/08/2019 0934 Last data filed at 10/08/2019 0900 Gross per 24 hour  Intake 380 ml  Output 800 ml  Net -420 ml   Filed Weights   10/05/19 0407 10/06/19 0359 10/07/19 0423  Weight: (!) 152.5 kg (!) 148.7 kg (!) 148.7 kg    Examination:  GEN:  middle aged woman, obese, supine, no distress on nasal cannula HEENT: No JVD, mild pallor, no icterus CV: RRR, ext warm PULM: Inspiratory mild stridor, no rhonchi, no accessory muscle use GI: Soft, hypoactive BS EXT: no edema appreciated NEURO: Moves all 4 ext to command PSYCH: RASS 0, flat affect SKIN: No rashes   Chest x-ray 4/3 personally reviewed which shows improved aeration bilaterally, decreased bilateral interstitial infiltrates  Resolved Hospital Problem list   Afib/RVR reactive, self-resolved Transaminitis- improving Bleeding from CVC site  Assessment & Plan:  Severe ARDS due to COVID 19 pneumonia: -Resolving, wean O2 as able, encourage IS, progressive mobility with PT  Stridor- 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  Worsened with loss of diaphragm excursion at night leading to respiratory distress and de-recruitment. - PPI, singulair, steroid trial - qHS restoril to make BIPAP tolerance better, likely has OSA -ENT consult  Hx of Asthma - albuterol prn prior to admission - breo, combivent PRN  Hx DM-  - SSI, sugars have been fine  Dysphagia- dysphagia 3  Can transfer out of ICU once cleared by ENT She is beyond 2-week window and can probably come out of Covid isolation, will discuss with infection control ? CIR candidate  6/3 MD. Cyril Mourning. [facility]  If no response to pager , please call [phone]   10/08/2019

## 2019-10-08 NOTE — Progress Notes (Signed)
Assisted tele visit to patient with family member however family member was unable to join from their device. Attempted to help troubleshoot with still no success.   Redmond Pulling, RN

## 2019-10-08 NOTE — Progress Notes (Signed)
While pt was coughing, there appeared to be bleeding on left side of neck. Upon assessment, it was noted that spot felt hard to the touch. Possibly a hematoma. E-Link nurse notified. She said she will let the provider know. Pressure dressing applied. Will continue to monitor.

## 2019-10-08 NOTE — Consult Note (Signed)
Reason for Consult:stridor Referring Physician: hospitalist  Summer Guzman is an 54 y.o. female.  HPI: Patient with Covid previously that has been intubated and extubated multiple times.  She was recently extubated about less than a week ago.  She has had hoarseness and very low volume voice since.  She also is having some stridor that is improved from initial presentation.  The nurses state when she gets up into a chair the stridor does not get any significantly worse.  She is on BiPAP at times.  She has been tried on racemic epi without much improvement.  Past Medical History:  Diagnosis Date  . Asthma     Past Surgical History:  Procedure Laterality Date  . CESAREAN SECTION      History reviewed. No pertinent family history.  Social History:  reports that she has never smoked. She has never used smokeless tobacco. She reports previous alcohol use. She reports previous drug use.  Allergies: No Known Allergies  Medications: I have reviewed the patient's current medications.  Results for orders placed or performed during the hospital encounter of 09/12/2019 (from the past 48 hour(s))  Glucose, capillary     Status: Abnormal   Collection Time: 10/06/19  4:32 PM  Result Value Ref Range   Glucose-Capillary 103 (H) 70 - 99 mg/dL    Comment: Glucose reference range applies only to samples taken after fasting for at least 8 hours.  Glucose, capillary     Status: Abnormal   Collection Time: 10/06/19  8:26 PM  Result Value Ref Range   Glucose-Capillary 104 (H) 70 - 99 mg/dL    Comment: Glucose reference range applies only to samples taken after fasting for at least 8 hours.  Glucose, capillary     Status: None   Collection Time: 10/06/19 11:17 PM  Result Value Ref Range   Glucose-Capillary 92 70 - 99 mg/dL    Comment: Glucose reference range applies only to samples taken after fasting for at least 8 hours.  Glucose, capillary     Status: Abnormal   Collection Time: 10/07/19  4:27 AM   Result Value Ref Range   Glucose-Capillary 170 (H) 70 - 99 mg/dL    Comment: Glucose reference range applies only to samples taken after fasting for at least 8 hours.  CBC     Status: Abnormal   Collection Time: 10/07/19  6:17 AM  Result Value Ref Range   WBC 27.9 (H) 4.0 - 10.5 K/uL   RBC 5.05 3.87 - 5.11 MIL/uL   Hemoglobin 10.8 (L) 12.0 - 15.0 g/dL    Comment: REPEATED TO VERIFY   HCT 35.3 (L) 36.0 - 46.0 %   MCV 69.9 (L) 80.0 - 100.0 fL   MCH 21.4 (L) 26.0 - 34.0 pg   MCHC 30.6 30.0 - 36.0 g/dL   RDW 18.7 (H) 11.5 - 15.5 %   Platelets 191 150 - 400 K/uL    Comment: REPEATED TO VERIFY   nRBC 0.0 0.0 - 0.2 %    Comment: Performed at [facility], [place] [address] [place], [place] [address]  Basic metabolic panel     Status: Abnormal   Collection Time: 10/07/19  6:17 AM  Result Value Ref Range   Sodium 141 135 - 145 mmol/L   Potassium 4.9 3.5 - 5.1 mmol/L   Chloride 101 98 - 111 mmol/L   CO2 31 22 - 32 mmol/L   Glucose, Bld 156 (H) 70 - 99 mg/dL    Comment:  Glucose reference range applies only to samples taken after fasting for at least 8 hours.   BUN 16 6 - 20 mg/dL   Creatinine, Ser 1.02 0.44 - 1.00 mg/dL   Calcium 9.0 8.9 - 58.5 mg/dL   GFR calc non Af Amer >60 >60 mL/min   GFR calc Af Amer >60 >60 mL/min   Anion gap 9 5 - 15    Comment: Performed at [facility], [address] [place], [place] [address]  Magnesium     Status: Abnormal   Collection Time: 10/07/19  6:17 AM  Result Value Ref Range   Magnesium 2.5 (H) 1.7 - 2.4 mg/dL    Comment: Performed at [facility], [address], [place], [place] [address]  Glucose, capillary     Status: Abnormal   Collection Time: 10/07/19  7:43 AM  Result Value Ref Range   Glucose-Capillary 143 (H) 70 - 99 mg/dL    Comment: Glucose reference range applies only to samples taken after fasting for at least 8 hours.  Glucose, capillary     Status: Abnormal   Collection Time: 10/07/19 11:48 AM  Result Value  Ref Range   Glucose-Capillary 122 (H) 70 - 99 mg/dL    Comment: Glucose reference range applies only to samples taken after fasting for at least 8 hours.  Glucose, capillary     Status: None   Collection Time: 10/07/19  5:17 PM  Result Value Ref Range   Glucose-Capillary 94 70 - 99 mg/dL    Comment: Glucose reference range applies only to samples taken after fasting for at least 8 hours.  Glucose, capillary     Status: Abnormal   Collection Time: 10/07/19  7:26 PM  Result Value Ref Range   Glucose-Capillary 108 (H) 70 - 99 mg/dL    Comment: Glucose reference range applies only to samples taken after fasting for at least 8 hours.  Glucose, capillary     Status: Abnormal   Collection Time: 10/07/19 11:48 PM  Result Value Ref Range   Glucose-Capillary 115 (H) 70 - 99 mg/dL    Comment: Glucose reference range applies only to samples taken after fasting for at least 8 hours.  Basic metabolic panel     Status: Abnormal   Collection Time: 10/08/19  3:26 AM  Result Value Ref Range   Sodium 141 135 - 145 mmol/L   Potassium 4.3 3.5 - 5.1 mmol/L   Chloride 102 98 - 111 mmol/L   CO2 27 22 - 32 mmol/L   Glucose, Bld 133 (H) 70 - 99 mg/dL    Comment: Glucose reference range applies only to samples taken after fasting for at least 8 hours.   BUN 16 6 - 20 mg/dL   Creatinine, Ser 6.14 0.44 - 1.00 mg/dL   Calcium 9.1 8.9 - 43.1 mg/dL   GFR calc non Af Amer >60 >60 mL/min   GFR calc Af Amer >60 >60 mL/min   Anion gap 12 5 - 15    Comment: Performed at [facility], [address] [place], [place] [address]  Magnesium     Status: Abnormal   Collection Time: 10/08/19  3:26 AM  Result Value Ref Range   Magnesium 2.5 (H) 1.7 - 2.4 mg/dL    Comment: Performed at [facility], [address], [place], [place] [address]  Glucose, capillary     Status: Abnormal   Collection Time: 10/08/19  4:01 AM  Result Value Ref  Range   Glucose-Capillary 141 (H) 70 - 99 mg/dL    Comment: Glucose  reference range applies only to samples taken after fasting for at least 8 hours.  Glucose, capillary     Status: Abnormal   Collection Time: 10/08/19  7:37 AM  Result Value Ref Range   Glucose-Capillary 100 (H) 70 - 99 mg/dL    Comment: Glucose reference range applies only to samples taken after fasting for at least 8 hours.  Glucose, capillary     Status: Abnormal   Collection Time: 10/08/19 12:05 PM  Result Value Ref Range   Glucose-Capillary 105 (H) 70 - 99 mg/dL    Comment: Glucose reference range applies only to samples taken after fasting for at least 8 hours.  Glucose, capillary     Status: Abnormal   Collection Time: 10/08/19  3:21 PM  Result Value Ref Range   Glucose-Capillary 143 (H) 70 - 99 mg/dL    Comment: Glucose reference range applies only to samples taken after fasting for at least 8 hours.    No results found.  Review of Systems Blood pressure 137/88, pulse 91, temperature 99.6 F (37.6 C), temperature source Oral, resp. rate (!) 21, height 5\' 6"  (1.676 m), weight (!) 148.7 kg, last menstrual period 09/21/2019, SpO2 99 %. Physical Exam  Constitutional: She appears well-developed and well-nourished.  HENT:  She is informed the risk and benefits of the procedure and options were discussed all questions were answered and consent was obtained.  Fiberoptic exam reveals no issues with the nose.  The nasopharynx is clear.  The hypopharynx/larynx there is no excessive swelling except for the vocal cords.  Both vocal cords have a plaque overlying the cords.  The left side is swollen and medially displaced.  There is no tissue appearance of a hematoma but it looks like there is a underlying swelling or collection of fluid.  Both cords are at the paramedian position which does narrow the airway.  She does not have obvious stridor as she is laying breathing but you can hear it sometimes in its inspiratory.  Both vocal cords appear to be somewhat limited and there abduction.  She  tolerated the procedure well  Eyes: Pupils are equal, round, and reactive to light. Conjunctivae are normal.  Musculoskeletal:     Cervical back: Normal range of motion and neck supple.    Assessment/Plan: Bilateral vocal cord swelling left greater than right-there is still some what appears to be plaque exudate that is probably from ulceration on both cords.  The left cord is swollen enough that it there may be some underlying condition like hematoma that I think needs to be evaluated.  I recommend a CT scan of the neck with contrast if possible.  Also she needs to be maximized on reflux medication.  I will follow up after the CT scan.  09/10/2019 10/08/2019, 4:01 PM

## 2019-10-08 NOTE — Progress Notes (Signed)
Physical Therapy Treatment Patient Details Name: Summer Guzman MRN: [id number] DOB: 07-17-1965 Today's Date: 10/08/2019    History of Present Illness 54 y.o. female admitted on 09/18/2019 for SOB.  Found to be COVID (+) 09/19/19 dx with severe ARDS due to COVID 19 PNA.  Noted to be in A-fib with RVR.  Pt intubated on 09/24/19 and extubated  10/03/19.  Pt with significant PMH of asthma.     PT Comments    Pt making slow progress. Continue to recommend CIR if pt able to come off of covid restrictions soon.    Follow Up Recommendations  CIR(if off covid precautions)     Equipment Recommendations  Rolling walker with 5" wheels;3in1 (PT);Other (comment)(bariatric)    Recommendations for Other Services Rehab consult     Precautions / Restrictions Precautions Precautions: Fall;Other (comment) Precaution Comments: watch O2 sats and HR during mobility.  Restrictions Weight Bearing Restrictions: No    Mobility  Bed Mobility Overal bed mobility: Needs Assistance Bed Mobility: Sit to Supine       Sit to supine: Mod assist;+2 for safety/equipment   General bed mobility comments: Assist to bring legs up into bed. Pt needed verbal cues on which direction to lie down  Transfers Overall transfer level: Needs assistance Equipment used: 2 person hand held assist Transfers: Sit to/from [employer] Sit to Stand: Mod assist;+2 physical assistance Stand pivot transfers: Mod assist;+2 physical assistance;Min assist       General transfer comment: Assist to bring hips up from low chair. Took 3 attempts. Used the blanket under pt's hips to bring up. Pivotal steps from chair to bed with +2 min assist hand held  Ambulation/Gait             General Gait Details: Not attempted due to pt fatigue and no walker present on unit   Stairs             Wheelchair Mobility    Modified Rankin (Stroke Patients Only)       Balance Overall balance assessment: Needs  assistance Sitting-balance support: Feet supported;No upper extremity supported Sitting balance-Leahy Scale: Fair Sitting balance - Comments: close supervision EOB, not challenged   Standing balance support: Bilateral upper extremity supported Standing balance-Leahy Scale: Poor Standing balance comment: +2 min assist with hand held for static standing                            Cognition Arousal/Alertness: Awake/alert Behavior During Therapy: Anxious Overall Cognitive Status: Impaired/Different from baseline Area of Impairment: Problem solving;Orientation;Safety/judgement                 Orientation Level: Disoriented to;Time       Safety/Judgement: Decreased awareness of safety;Decreased awareness of deficits   Problem Solving: Slow processing;Requires verbal cues        Exercises General Exercises - Lower Extremity Long Arc Quad: AROM;Both;5 reps;Seated    General Comments        Pertinent Vitals/Pain Pain Assessment: No/denies pain    Home Living                      Prior Function            PT Goals (current goals can now be found in the care plan section) Progress towards PT goals: Progressing toward goals    Frequency    Min 3X/week      PT Plan Current plan remains appropriate  Co-evaluation              AM-PAC PT "6 Clicks" Mobility   Outcome Measure  Help needed turning from your back to your side while in a flat bed without using bedrails?: A Lot Help needed moving from lying on your back to sitting on the side of a flat bed without using bedrails?: A Lot Help needed moving to and from a bed to a chair (including a wheelchair)?: A Lot Help needed standing up from a chair using your arms (e.g., wheelchair or bedside chair)?: Total Help needed to walk in hospital room?: Total Help needed climbing 3-5 steps with a railing? : Total 6 Click Score: 9    End of Session Equipment Utilized During Treatment:  Oxygen Activity Tolerance: Patient limited by fatigue Patient left: with call bell/phone within reach;in bed;with bed alarm set;with nursing/sitter in room Nurse Communication: Mobility status(Nurse assisted with mobility) PT Visit Diagnosis: Muscle weakness (generalized) (M62.81);Difficulty in walking, not elsewhere classified (R26.2)     Time: 8657-8469 PT Time Calculation (min) (ACUTE ONLY): 15 min  Charges:  $Therapeutic Activity: 8-22 mins                     [place]ak[place] Pager [phone] Office [place] 10/08/2019, 3:42 PM

## 2019-10-09 ENCOUNTER — Inpatient Hospital Stay (HOSPITAL_COMMUNITY): Payer: No Typology Code available for payment source

## 2019-10-09 ENCOUNTER — Inpatient Hospital Stay (HOSPITAL_COMMUNITY): Payer: No Typology Code available for payment source | Admitting: Registered Nurse

## 2019-10-09 LAB — GLUCOSE, CAPILLARY
Glucose-Capillary: 125 mg/dL — ABNORMAL HIGH (ref 70–99)
Glucose-Capillary: 113 mg/dL — ABNORMAL HIGH (ref 70–99)
Glucose-Capillary: 107 mg/dL — ABNORMAL HIGH (ref 70–99)
Glucose-Capillary: 138 mg/dL — ABNORMAL HIGH (ref 70–99)
Glucose-Capillary: 142 mg/dL — ABNORMAL HIGH (ref 70–99)
Glucose-Capillary: 231 mg/dL — ABNORMAL HIGH (ref 70–99)

## 2019-10-09 LAB — POCT I-STAT 7, (LYTES, BLD GAS, ICA,H+H)
Acid-Base Excess: 6 mmol/L — ABNORMAL HIGH (ref 0.0–2.0)
Bicarbonate: 31.9 mmol/L — ABNORMAL HIGH (ref 20.0–28.0)
Calcium, Ion: 1.26 mmol/L (ref 1.15–1.40)
HCT: 47 % — ABNORMAL HIGH (ref 36.0–46.0)
Hemoglobin: 16 g/dL — ABNORMAL HIGH (ref 12.0–15.0)
O2 Saturation: 83 %
Patient temperature: 99
Potassium: 3.9 mmol/L (ref 3.5–5.1)
Sodium: 136 mmol/L (ref 135–145)
TCO2: 33 mmol/L — ABNORMAL HIGH (ref 22–32)
pCO2 arterial: 48.8 mmHg — ABNORMAL HIGH (ref 32.0–48.0)
pH, Arterial: 7.424 (ref 7.350–7.450)
pO2, Arterial: 48 mmHg — ABNORMAL LOW (ref 83.0–108.0)

## 2019-10-09 LAB — BASIC METABOLIC PANEL
Anion gap: 9 (ref 5–15)
BUN: 15 mg/dL (ref 6–20)
CO2: 29 mmol/L (ref 22–32)
Calcium: 9.3 mg/dL (ref 8.9–10.3)
Chloride: 102 mmol/L (ref 98–111)
Creatinine, Ser: 0.53 mg/dL (ref 0.44–1.00)
GFR calc Af Amer: >60 mL/min (ref >60)
GFR calc non Af Amer: >60 mL/min (ref >60)
Glucose, Bld: 131 mg/dL — ABNORMAL HIGH (ref 70–99)
Potassium: 4 mmol/L (ref 3.5–5.1)
Sodium: 140 mmol/L (ref 135–145)

## 2019-10-09 LAB — MAGNESIUM: Magnesium: 2.4 mg/dL (ref 1.7–2.4)

## 2019-10-09 MED ORDER — PROPOFOL 1000 MG/100ML IV EMUL
INTRAVENOUS | Status: AC
  Administered 2019-10-10: 20 ug/kg/min via INTRAVENOUS
  Filled 2019-10-09: qty 100

## 2019-10-09 MED ORDER — FUROSEMIDE 10 MG/ML IJ SOLN
40.0000 mg | Freq: Once | INTRAMUSCULAR | Status: AC
  Administered 2019-10-09: 40 mg via INTRAVENOUS
  Filled 2019-10-09: qty 4

## 2019-10-09 NOTE — Progress Notes (Signed)
eLink Physician-Brief Progress Note Patient Name: Summer Guzman DOB: Nov 17, 1965 MRN: [id number]   Date of Service  10/09/2019  HPI/Events of Note  Called d/t increased WOB on BiPAP. RR = 35-45. Sat = 88-92% and HR = 140's. She looks labored on video assessment. Needs intubation. Nursing states that she is a difficult airway with trach set at bedside.   eICU Interventions  Plan: 1. Will request that PCCM ground team evaluate the patient at bedside.      Intervention Category Major Interventions: Other:  Lenell Antu 10/09/2019, 10:56 PM

## 2019-10-09 NOTE — Progress Notes (Signed)
Physical Therapy Treatment Patient Details Name: Summer Guzman MRN: [id number] DOB: January 05, 1966 Today's Date: 10/09/2019    History of Present Illness 54 y.o. female admitted on 09/25/2019 for SOB.  Found to be COVID (+) 09/19/19 dx with severe ARDS due to COVID 19 PNA.  Noted to be in A-fib with RVR.  Pt intubated on 09/24/19 and extubated  10/03/19.  Pt with significant PMH of asthma.     PT Comments    On entry pt with BiPAP in place, RN agreeable to bed level exercise. Pt tolerated well and RN agreeable to sitting EoB. Pt requires maxAx2 for sitting EoB. Pt sat EoB for ~20 minutes where she worked on breathing, however BiPAP mask too tight causing increased anxiety in pt. Respiratory therapist assisted and ultimately placed pt on 5L O2 via [place]. Pt able to maintain SaO2 >91%O2. Pt with c/o of increased back pain and request to return to bed. Pt requires total A to return to bed and scoot up as well as for rolling to place clean linens. Once cleaned up, pt able to come to long sitting in bed with modAx2 and hold herself with bedrails while lotion applied to her back. CIR recommendation remains appropriate as pt will be outside COVID window 10/15/19. PT will continue to follow acutely.    Follow Up Recommendations  CIR     Equipment Recommendations  Rolling walker with 5" wheels;3in1 (PT);Other (comment)(bariatric)    Recommendations for Other Services Rehab consult     Precautions / Restrictions Precautions Precautions: Fall;Other (comment) Precaution Comments: watch O2 sats and HR during mobility.  Restrictions Weight Bearing Restrictions: No    Mobility  Bed Mobility Overal bed mobility: Needs Assistance Bed Mobility: Sit to Supine;Supine to Sit     Supine to sit: Max assist;+2 for physical assistance;Mod assist Sit to supine: Total assist;+2 for physical assistance   General bed mobility comments: maxAx2 for sitting EoB, assist with LE management once hips scooted around pt able to  push up to upright from elevated HoB, total A for return to bed, modA to use rails and come to longsitting for application of lotion to her back   Transfers                 General transfer comment: did not attempt today due to back pain and 4/4 DoE      Balance Overall balance assessment: Needs assistance Sitting-balance support: Feet supported;No upper extremity supported Sitting balance-Leahy Scale: Poor Sitting balance - Comments: min guard EoB for static sitting balance                                    Cognition Arousal/Alertness: Awake/alert Behavior During Therapy: Anxious Overall Cognitive Status: Impaired/Different from baseline Area of Impairment: Problem solving;Orientation;Safety/judgement                         Safety/Judgement: Decreased awareness of safety;Decreased awareness of deficits   Problem Solving: Slow processing;Requires verbal cues General Comments: Pt requires increased time to process commands, especially with [employer] usage      Exercises General Exercises - Upper Extremity Elbow Flexion: AAROM Elbow Extension: AAROM Digit Composite Flexion: AAROM Composite Extension: AAROM    General Comments General comments (skin integrity, edema, etc.): Max HR 153, max RR 47 (overall sustaining in the 30s-low 40s), O2 sats >/=91% on both BiPAP (35%, fluctuating PEEP) and 5L O2  Pertinent Vitals/Pain Pain Assessment: Faces Faces Pain Scale: Hurts even more Pain Location: low back with sitting EoB Pain Descriptors / Indicators: Aching;Grimacing Pain Intervention(s): Limited activity within patient's tolerance;Monitored during session;Repositioned     PT Goals (current goals can now be found in the care plan section) Acute Rehab PT Goals PT Goal Formulation: With patient Time For Goal Achievement: 10/19/19 Potential to Achieve Goals: Good Progress towards PT goals: Not progressing toward goals - comment(limited  by increased O2 need and fatigur)    Frequency    Min 3X/week      PT Plan Current plan remains appropriate    Co-evaluation PT/OT/SLP Co-Evaluation/Treatment: Yes Reason for Co-Treatment: Complexity of the patient's impairments (multi-system involvement);For patient/therapist safety PT goals addressed during session: Mobility/safety with mobility        AM-PAC PT "6 Clicks" Mobility   Outcome Measure  Help needed turning from your back to your side while in a flat bed without using bedrails?: A Lot Help needed moving from lying on your back to sitting on the side of a flat bed without using bedrails?: Total Help needed moving to and from a bed to a chair (including a wheelchair)?: Total Help needed standing up from a chair using your arms (e.g., wheelchair or bedside chair)?: Total Help needed to walk in hospital room?: Total Help needed climbing 3-5 steps with a railing? : Total 6 Click Score: 7    End of Session Equipment Utilized During Treatment: Oxygen Activity Tolerance: Patient limited by fatigue Patient left: with call bell/phone within reach;in bed;with bed alarm set;with nursing/sitter in room Nurse Communication: Mobility status(Nurse assisted with mobility ) PT Visit Diagnosis: Muscle weakness (generalized) (M62.81);Difficulty in walking, not elsewhere classified (R26.2)     Time: 8101-7510 PT Time Calculation (min) (ACUTE ONLY): 63 min  Charges:  $Therapeutic Activity: 23-37 mins                     Keshia Weare B. Beverely Risen PT, DPT Acute Rehabilitation Services Pager [phone] Office [phone]    [facility] 10/09/2019, 4:54 PM

## 2019-10-09 NOTE — Progress Notes (Signed)
Inpatient Rehabilitation Admissions Coordinator  Inpatient rehab consult received. Noted patient currently on isolation for COVID and PCCM to discuss with infection control when can remove isolation. Noted increased work of breathing today. I will follow.  Ottie Glazier, RN, MSN Rehab Admissions Coordinator [phone] 10/09/2019 11:31 AM

## 2019-10-09 NOTE — Anesthesia Procedure Notes (Addendum)
Procedure Name: Intubation Date/Time: 10/09/2019 11:50 PM Performed by: Zollie Scale, CRNA Pre-anesthesia Checklist: Patient identified, Emergency Drugs available, Suction available and Patient being monitored Patient Re-evaluated:Patient Re-evaluated prior to induction Oxygen Delivery Method: Ambu bag Preoxygenation: Pre-oxygenation with 100% oxygen Induction Type: IV induction and Rapid sequence Laryngoscope Size: Glidescope and 3 Grade View: Grade II Tube type: Oral Tube size: 7.5 mm Number of attempts: 1 Airway Equipment and Method: Stylet and Video-laryngoscopy Placement Confirmation: ETT inserted through vocal cords under direct vision,  breath sounds checked- equal and bilateral and CO2 detector Secured at: 23 cm Tube secured with: ICU Securement Device applied by RT. Dental Injury: Teeth and Oropharynx as per pre-operative assessment  Difficulty Due To: Difficulty was anticipated and Difficult Airway-  due to edematous airway Comments: [place] MD present at bedside.

## 2019-10-09 NOTE — Progress Notes (Addendum)
NAME:  Summer Guzman, MRN:  [id number], DOB:  [birth year], LOS: 84 ADMISSION DATE:  09/26/19, CONSULTATION DATE:  3/22 REFERRING MD: Wynelle Cleveland, CHIEF COMPLAINT:  Dyspnea   Brief History   54 y/o female admitted to [facility] with ARDS due to COVID pneumonia on 3/21.  Intubated 3/22.  Initial positive test 3/17 received Actemra, remdesivir, decadron.  Developed atrial fib with RVR.  She was extubated 3/31 but had persistent stridor  Past Medical History  asthma  Significant Hospital Events   3/21 admission 3/22 intubation, transfer from [facility] to [facility] 3/27 hypoxia improving 3/31 extubated, issues with stridor improved with dexamethasone, racemic epinephrine, and BIPAP 4/2-4/3, transferred to progressive, had another episode of stridor and sent back to unit 4/3-4/4 same episode  Consults:  PCCM  Procedures:  3/22 ETT > 3/31 3/22 L IJ CVL > 3/31 3/22 radial A-line> removed  Significant Diagnostic Tests:  3/21 TTE> LVEF 70-75%, RV function normal, valves OK 4/5 neck CT >>  small 12 mm focus of gas posterior and superior to the left arytenoid cartilage is suspicious for a contained soft tissue injury  Micro Data:  3/17 SARS COV 2 > positive outside [place] 3/21 blood > neg 3/22 sputum> NG  3/27 resp: ngtd  Antimicrobials/COVID Rx:  3/21 cefepime x1 3/21 tocilizumab x1 3/21 remdesivir > 3/25 3/21 decadron > 3/30  Interim history/subjective:   Used BiPAP overnight This morning developed audible stridor with increased work of breathing, given neb, then placed back on BiPAP  Objective   Blood pressure (!) 151/75, pulse (!) 108, temperature 98.6 F (37 C), temperature source Oral, resp. rate (!) 42, height 5\' 6"  (1.676 m), weight (!) 148.7 kg, last menstrual period 09/21/2019, SpO2 97 %.    Vent Mode: BIPAP;PCV FiO2 (%):  [35 %] 35 % Set Rate:  [8 bmp] 8 bmp PEEP:  [5 cmH20] 5 cmH20   Intake/Output Summary (Last 24 hours) at 10/09/2019 1007 Last data filed at 10/09/2019  0900 Gross per 24 hour  Intake 370 ml  Output 250 ml  Net 120 ml   Filed Weights   10/05/19 0407 10/06/19 0359 10/07/19 0423  Weight: (!) 152.5 kg (!) 148.7 kg (!) 148.7 kg    Examination:  GEN: middle aged woman, obese, sitting up, mild distress on nasal cannula HEENT: No JVD, mild pallor, no icterus CV: RRR, ext warm PULM: Inspiratory  stridor, no rhonchi, + accessory muscle use GI: Soft, hypoactive BS EXT: no edema appreciated NEURO: Moves all 4 ext to command PSYCH: RASS 0, flat affect SKIN: No rashes   Chest x-ray 4/3 personally reviewed which shows improved aeration bilaterally, decreased bilateral interstitial infiltrates  Resolved Hospital Problem list   Afib/RVR reactive, self-resolved Transaminitis- improving Bleeding from CVC site  Assessment & Plan:  Severe ARDS due to COVID 19 pneumonia: -Resolving, down to 4 L nasal cannula, wean O2 as able, encourage IS, progressive mobility with PT  Stridor- 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  Worsened with loss of diaphragm excursion at night leading to respiratory distress and de-recruitment. ENT eval 4/5 >> vocal cord swollen with plaque, no hematoma, limited abduction bilateral 4/6 appears worse with increased work of breathing - PPI, singulair, DEXA 4 every 12 for 5 days since 4/3 -Use BiPAP as needed for work of breathing and nightly, IV Ativan as needed to make BIPAP tolerance better, likely has OSA -ENT following -Lasix 40 IV x1  Hx of Asthma - albuterol prn prior to admission - breo, combivent  PRN  Hx DM-  - SSI, controlled  Dysphagia- dysphagia 3  She is approaching 3-week window and can probably come out of Covid isolation, will discuss with infection control Probably CIR candidate once acute issues resolve Sister Hector Shade updated  The patient is critically ill with multiple organ systems failure and requires high complexity decision making for assessment and support, frequent evaluation and  titration of therapies, application of advanced monitoring technologies and extensive interpretation of multiple databases. Critical Care Time devoted to patient care services described in this note independent of APP/resident  time is 31 minutes.    Cyril Mourning MD. Tonny Bollman. [place] [facility]  If no response to pager , please call [phone]   10/09/2019

## 2019-10-09 NOTE — Progress Notes (Addendum)
Occupational Therapy Treatment Patient Details Name: Summer Guzman MRN: [id number] DOB: Jul 05, 1966 Today's Date: 10/09/2019    History of present illness 54 y.o. female admitted on 09/18/2019 for SOB.  Found to be COVID (+) 09/19/19 dx with severe ARDS due to COVID 19 PNA.  Noted to be in A-fib with RVR.  Pt intubated on 09/24/19 and extubated  10/03/19.  Pt with significant PMH of asthma.    OT comments  Pt making slow progress towards OT goals, fatigued today with sitting EOB and with reports of increased back pain and not able to tolerate standing attempts. RT in to transition pt to nasal canula from biPAP during session with pt tolerating well, but with increased WOB and RR with sitting upright (also partly due to discomfort from wearing bipap initially). Pt requiring increased time for all mobility attempts. Assisted with changing bed linens and pericare after return to supine. Despite fatigue and pain pt remains motivated to work with therapies.  Feel POC remains appropriate at this time. Will continue per POC.   Follow Up Recommendations  CIR(pending pt out of Covid precautions)    Equipment Recommendations  Other (comment)(TBD)          Precautions / Restrictions Precautions Precautions: Fall;Other (comment) Precaution Comments: watch O2 sats and HR during mobility.  Restrictions Weight Bearing Restrictions: No       Mobility Bed Mobility Overal bed mobility: Needs Assistance Bed Mobility: Sit to Supine;Supine to Sit     Supine to sit: Max assist;+2 for physical assistance;Mod assist Sit to supine: Total assist;+2 for physical assistance   General bed mobility comments: maxAx2 for sitting EoB, assist with LE management once hips scooted around pt able to push up to upright from elevated HoB, total A for return to bed, modA to use rails and come to longsitting for application of lotion to her back   Transfers                 General transfer comment: did not attempt  today due to back pain and 4/4 DoE    Balance Overall balance assessment: Needs assistance Sitting-balance support: Feet supported;No upper extremity supported Sitting balance-Leahy Scale: Poor Sitting balance - Comments: min guard EoB for static sitting balance                                   ADL either performed or assessed with clinical judgement   ADL Overall ADL's : Needs assistance/impaired     Grooming: Minimal assistance;Moderate assistance;Sitting Grooming Details (indicate cue type and reason): assist to use yonker                     Toileting- Architect and Hygiene: Total assistance;Bed level Toileting - Clothing Manipulation Details (indicate cue type and reason): assist for pericare        General ADL Comments: pt tolerated sitting EOB during session, not able to tolerate standing attempts due to pain in back and increased RR/discomfort with initially wearing biPAP (RT in and switching pt to nasal cannula during session)               Cognition Arousal/Alertness: Awake/alert Behavior During Therapy: Anxious Overall Cognitive Status: Impaired/Different from baseline Area of Impairment: Problem solving;Orientation;Safety/judgement                         Safety/Judgement: Decreased awareness of safety;Decreased  awareness of deficits   Problem Solving: Slow processing;Requires verbal cues General Comments: Pt requires increased time to process commands, especially with [place] usage        Exercises Exercises: General Upper Extremity General Exercises - Upper Extremity Elbow Flexion: AAROM Elbow Extension: AAROM Digit Composite Flexion: AAROM Composite Extension: AAROM   Shoulder Instructions       General Comments Max HR 153, max RR 47 (overall sustaining in the 30s-low 40s), O2 sats >/=91% on both BiPAP (35%, fluctuating PEEP) and 5L O2     Pertinent Vitals/ Pain       Pain Assessment: Faces Faces  Pain Scale: Hurts even more Pain Location: low back with sitting EoB Pain Descriptors / Indicators: Aching;Grimacing Pain Intervention(s): Limited activity within patient's tolerance;Monitored during session;Repositioned  Home Living                                          Prior Functioning/Environment              Frequency  Min 2X/week        Progress Toward Goals  OT Goals(current goals can now be found in the care plan section)  Progress towards OT goals: OT to reassess next treatment  Acute Rehab OT Goals Patient Stated Goal: "I miss my son" OT Goal Formulation: With patient Time For Goal Achievement: 10/19/19 Potential to Achieve Goals: Good  Plan Discharge plan remains appropriate    Co-evaluation    PT/OT/SLP Co-Evaluation/Treatment: Yes Reason for Co-Treatment: Complexity of the patient's impairments (multi-system involvement);For patient/therapist safety;To address functional/ADL transfers PT goals addressed during session: Mobility/safety with mobility OT goals addressed during session: ADL's and self-care      AM-PAC OT "6 Clicks" Daily Activity     Outcome Measure   Help from another person eating meals?: A Little Help from another person taking care of personal grooming?: A Lot Help from another person toileting, which includes using toliet, bedpan, or urinal?: Total Help from another person bathing (including washing, rinsing, drying)?: A Lot Help from another person to put on and taking off regular upper body clothing?: A Lot Help from another person to put on and taking off regular lower body clothing?: Total 6 Click Score: 11    End of Session Equipment Utilized During Treatment: Oxygen  OT Visit Diagnosis: Other abnormalities of gait and mobility (R26.89);Muscle weakness (generalized) (M62.81);Unsteadiness on feet (R26.81)   Activity Tolerance Patient tolerated treatment well;Patient limited by fatigue   Patient Left  in bed;with call bell/phone within reach;with bed alarm set   Nurse Communication Mobility status        Time: 4315-4008 OT Time Calculation (min): 63 min  Charges: OT General Charges $OT Visit: 1 Visit OT Treatments $Self Care/Home Management : 23-37 mins  Lou Cal, OT Acute Rehabilitation Services Pager [phone] Office [place] 10/09/2019, 5:42 PM

## 2019-10-09 NOTE — Progress Notes (Signed)
Patient with audible stridor and labored work of breathing with respiratory rate 40's to 50.Saturations of 94-96.Fannie Knee RT called to room

## 2019-10-09 NOTE — Progress Notes (Signed)
Patient has requested to be placed back on Bipap .RT informed . She has labored respirations and increased rate to 35 .

## 2019-10-09 NOTE — Progress Notes (Signed)
Patient requested to come off BIPAP. RT placed patient on 15L [place] HFNC. Patient's respirations continued to be labored and spo2 decreased to 80%. RT placed patient back on BIPAP at this time. Spo2 increased to 90%, RR 35. RT will continue to monitor.

## 2019-10-09 NOTE — Progress Notes (Signed)
Patient taken off Bipap at 1610 and placed on [place] 5 L with humidity. Patient has a productive cough with tan secretions. Patient tolerating [place] well at this time. RN at bedside.

## 2019-10-09 NOTE — Progress Notes (Signed)
Due to progressive desaturation despite BIPAP 100% decision was made to intubate.  Due to laryngeal swelling and caveat of difficult airway possible need for tracheostomy to obtain airway I asked anesthesia to intubate.  No difficulty with intubation.  Remains hypoxemic on vent. Etiology of hypoxemia unclear-? Aspiration earlier on the shift?  Patient became much less responsive after ativan dose earlier this evening.  CXR shows good tube placement, bilateral hilar alveolar infiltrates.  Will start zosyn for possible aspiration and give a dose of lasix for possible pulmonary edema.  Having some red to maroon blood from og tube, will restart pepcid, hold lovenox.

## 2019-10-10 ENCOUNTER — Inpatient Hospital Stay (HOSPITAL_COMMUNITY): Payer: No Typology Code available for payment source

## 2019-10-10 DIAGNOSIS — I469 Cardiac arrest, cause unspecified: Secondary | ICD-10-CM

## 2019-10-10 LAB — CBC
HCT: 40.7 % (ref 36.0–46.0)
Hemoglobin: 11.8 g/dL — ABNORMAL LOW (ref 12.0–15.0)
MCH: 21.7 pg — ABNORMAL LOW (ref 26.0–34.0)
MCHC: 29 g/dL — ABNORMAL LOW (ref 30.0–36.0)
MCV: 75 fL — ABNORMAL LOW (ref 80.0–100.0)
Platelets: 159 10*3/uL (ref 150–400)
RBC: 5.43 MIL/uL — ABNORMAL HIGH (ref 3.87–5.11)
RDW: 19.4 % — ABNORMAL HIGH (ref 11.5–15.5)
WBC: 7.1 10*3/uL (ref 4.0–10.5)
nRBC: 0.6 % — ABNORMAL HIGH (ref 0.0–0.2)

## 2019-10-10 LAB — BASIC METABOLIC PANEL
Anion gap: 22 — ABNORMAL HIGH (ref 5–15)
BUN: 20 mg/dL (ref 6–20)
CO2: 22 mmol/L (ref 22–32)
Calcium: 8.6 mg/dL — ABNORMAL LOW (ref 8.9–10.3)
Chloride: 100 mmol/L (ref 98–111)
Creatinine, Ser: 1.37 mg/dL — ABNORMAL HIGH (ref 0.44–1.00)
GFR calc Af Amer: 51 mL/min — ABNORMAL LOW (ref >60)
GFR calc non Af Amer: 44 mL/min — ABNORMAL LOW (ref >60)
Glucose, Bld: 231 mg/dL — ABNORMAL HIGH (ref 70–99)
Potassium: 5.3 mmol/L — ABNORMAL HIGH (ref 3.5–5.1)
Sodium: 144 mmol/L (ref 135–145)

## 2019-10-10 LAB — POCT I-STAT 7, (LYTES, BLD GAS, ICA,H+H)
Acid-base deficit: 10 mmol/L — ABNORMAL HIGH (ref 0.0–2.0)
Bicarbonate: 20.3 mmol/L (ref 20.0–28.0)
Calcium, Ion: 0.6 mmol/L — CL (ref 1.15–1.40)
HCT: 37 % (ref 36.0–46.0)
Hemoglobin: 12.6 g/dL (ref 12.0–15.0)
O2 Saturation: 29 %
Patient temperature: 101
Potassium: 3.6 mmol/L (ref 3.5–5.1)
Sodium: 137 mmol/L (ref 135–145)
TCO2: 22 mmol/L (ref 22–32)
pCO2 arterial: 66.1 mmHg (ref 32.0–48.0)
pH, Arterial: 7.104 — CL (ref 7.350–7.450)
pO2, Arterial: 28 mmHg — CL (ref 83.0–108.0)

## 2019-10-10 LAB — PROTIME-INR
INR: 1.4 — ABNORMAL HIGH (ref 0.8–1.2)
Prothrombin Time: 16.9 seconds — ABNORMAL HIGH (ref 11.4–15.2)

## 2019-10-10 LAB — TRIGLYCERIDES: Triglycerides: 114 mg/dL (ref <150)

## 2019-10-10 MED ORDER — SODIUM CHLORIDE 0.9 % IV BOLUS
1000.0000 mL | Freq: Once | INTRAVENOUS | Status: AC
  Administered 2019-10-10: 1000 mL via INTRAVENOUS

## 2019-10-10 MED ORDER — FENTANYL CITRATE (PF) 100 MCG/2ML IJ SOLN
100.0000 ug | INTRAMUSCULAR | Status: DC | PRN
  Administered 2019-10-10: 100 ug via INTRAVENOUS

## 2019-10-10 MED ORDER — FUROSEMIDE 10 MG/ML IJ SOLN: 20.0000 mg | Freq: Two times a day (BID) | INTRAMUSCULAR | Status: DC

## 2019-10-10 MED ORDER — FENTANYL 2500MCG IN NS 250ML (10MCG/ML) PREMIX INFUSION
0.0000 ug/h | INTRAVENOUS | Status: DC
  Administered 2019-10-10: 50 ug/h via INTRAVENOUS
  Filled 2019-10-10: qty 250

## 2019-10-10 MED ORDER — FENTANYL CITRATE (PF) 100 MCG/2ML IJ SOLN
INTRAMUSCULAR | Status: AC
  Administered 2019-10-10: 100 ug via INTRAVENOUS
  Filled 2019-10-10: qty 2

## 2019-10-10 MED ORDER — PHENYLEPHRINE HCL-NACL 10-0.9 MG/250ML-% IV SOLN
INTRAVENOUS | Status: AC
  Filled 2019-10-10: qty 250

## 2019-10-10 MED ORDER — SODIUM BICARBONATE 8.4 % IV SOLN
INTRAVENOUS | Status: AC
  Filled 2019-10-10: qty 50

## 2019-10-10 MED ORDER — PROPOFOL 10 MG/ML IV BOLUS
INTRAVENOUS | Status: DC | PRN
  Administered 2019-10-09: 200 mg via INTRAVENOUS

## 2019-10-10 MED ORDER — FENTANYL CITRATE (PF) 100 MCG/2ML IJ SOLN: 100.0000 ug | Freq: Once | INTRAMUSCULAR | Status: AC

## 2019-10-10 MED ORDER — EPINEPHRINE HCL 5 MG/250ML IV SOLN IN NS
0.5000 ug/min | INTRAVENOUS | Status: DC
  Filled 2019-10-10: qty 250

## 2019-10-10 MED ORDER — FUROSEMIDE 10 MG/ML IJ SOLN
20.0000 mg | Freq: Once | INTRAMUSCULAR | Status: AC
  Administered 2019-10-10: 20 mg via INTRAVENOUS
  Filled 2019-10-10: qty 2

## 2019-10-10 MED ORDER — PIPERACILLIN-TAZOBACTAM 3.375 G IVPB 30 MIN
3.3750 g | Freq: Once | INTRAVENOUS | Status: AC
  Administered 2019-10-10: 3.375 g via INTRAVENOUS
  Filled 2019-10-10: qty 50

## 2019-10-10 MED ORDER — SUCCINYLCHOLINE CHLORIDE 20 MG/ML IJ SOLN
INTRAMUSCULAR | Status: DC | PRN
  Administered 2019-10-09: 160 mg via INTRAVENOUS

## 2019-10-10 MED ORDER — ATROPINE SULFATE 1 MG/10ML IJ SOSY
PREFILLED_SYRINGE | INTRAMUSCULAR | Status: AC
  Filled 2019-10-10: qty 10

## 2019-10-10 MED ORDER — PIPERACILLIN-TAZOBACTAM 3.375 G IVPB: 3.3750 g | Freq: Three times a day (TID) | INTRAVENOUS | Status: DC

## 2019-10-10 MED ORDER — ROCURONIUM BROMIDE 10 MG/ML (PF) SYRINGE
PREFILLED_SYRINGE | INTRAVENOUS | Status: AC
  Administered 2019-10-10: 100 mg
  Filled 2019-10-10: qty 10

## 2019-10-10 MED ORDER — FAMOTIDINE IN NACL 20-0.9 MG/50ML-% IV SOLN
20.0000 mg | Freq: Two times a day (BID) | INTRAVENOUS | Status: DC
  Filled 2019-10-10: qty 50

## 2019-10-10 MED ORDER — PHENYLEPHRINE HCL-NACL 10-0.9 MG/250ML-% IV SOLN
25.0000 ug/min | INTRAVENOUS | Status: DC
  Administered 2019-10-10: 200 ug/min via INTRAVENOUS
  Filled 2019-10-10 (×3): qty 250

## 2019-10-10 MED ORDER — LACTATED RINGERS IV SOLN: INTRAVENOUS | Status: DC | PRN

## 2019-10-10 MED ORDER — PROPOFOL 1000 MG/100ML IV EMUL: 5.0000 ug/kg/min | INTRAVENOUS | Status: DC

## 2019-10-10 MED ORDER — SODIUM CHLORIDE 0.9 % IV SOLN
250.0000 mL | INTRAVENOUS | Status: DC
  Administered 2019-10-10: 250 mL via INTRAVENOUS

## 2019-10-10 MED FILL — Medication: Qty: 1 | Status: AC

## 2019-11-03 NOTE — Progress Notes (Signed)
Chaplain responded to second Code Blue.  Entered unit.  Present when physician called family.  Physician concurred a follow up call by Chaplain to family might be welcomed. Spoke with sister Hector Shade who was grieving for her sister, asking why this was happening.  [employer] of presence, compassion and care.  Chaplain inquired if sister would welcome a return call in the morning.  Sister agreed that would be nice.  Chaplain will refer to day Chaplain for follow up phone call.  Vernell Morgans Chaplain Resident

## 2019-11-03 NOTE — Progress Notes (Signed)
eLink Physician-Brief Progress Note Patient Name: Summer Guzman DOB: 1965-09-24 MRN: [id number]   Date of Service  10-12-2019  HPI/Events of Note  Hypotension and Hypoxia - Recent intubation for increased respiratory distress.   eICU Interventions  Wiil order: 1. Turn Propofol IV infusion off. 2. Bolus with 0.9 NaCl 1 liter IV over 1 hour now. 3. Phenylephrine IV infusion via PIV. Titrate to MAP >= 65. 4. Will ask ground team to evaluate the patient at bedside.      Intervention Category Major Interventions: Hypotension - evaluation and management  Sharmon Cheramie Eugene 2019-10-12, 1:30 AM

## 2019-11-03 NOTE — Progress Notes (Signed)
250 ml Fentanyl wasted in sink with Tessa Lerner RN.

## 2019-11-03 NOTE — Procedures (Signed)
Central Venous Catheter Insertion Procedure Note  Procedure: Insertion of Central Venous Catheter  Indications:  vascular access  Procedure Details  Informed consent was obtained for the procedure, including sedation.  Risks of lung perforation, hemorrhage, arrhythmia, and adverse drug reaction were discussed.   Maximum sterile technique was used including antiseptics, cap, gloves, gown, hand hygiene, mask and sheet.  Under sterile conditions the skin above the on the right internal jugular vein was prepped with betadine and covered with a sterile drape. Local anesthesia was applied to the skin and subcutaneous tissues. A 22-gauge needle was used to identify the vein. An 18-gauge needle was then inserted into the vein. A guide wire was then passed easily through the catheter. There were no arrhythmias. The catheter was then withdrawn. A 7.5 French triple-lumen was then inserted into the vessel over the guide wire. The catheter was sutured into place.  Findings: There were no changes to vital signs. Catheter was flushed with 10 cc NS. Patient did tolerate procedure well.  Recommendations: CXR ordered to verify placement. 

## 2019-11-03 NOTE — Progress Notes (Signed)
Shortly after intubation patient developed massive hemoptysis and decrease in sats.  She underwent CPR with return of pulse, I performed bedside bronchoscopy emergently and noted diffuse bilateral hemorrhage.  Tried to control bleeding with ice water saline to no avail.  Central line was also placed emergently. Patient continued to loose her pulse and after am second conversation with her sister decision was made allow her to pass peacefully without continued resuscitative efforts due to futility of care.  I suspect hemorrhage was a late sequela of COVID PNA or pulmonary embolism.  Over 1 hour spent in critical care delivery.

## 2019-11-03 NOTE — Progress Notes (Signed)
Critical blood gas results delivered to Dr. Rande Lawman at pt bedside.

## 2019-11-03 NOTE — Discharge Summary (Signed)
Patient is a 54 year old female admitted with Covid pneumonia 10/11/19.  Time of admission she was hypoxemic requiring intubation 09/24/2019.  She slowly improved and was extubated on 10/03/2019.  She had difficulty over the next week with intermittent episodic stridor this seemed to improve with dexamethasone and racemic epinephrine.  These episodes seem to come and go.  Patient underwent CT scan of the neck which showed some swelling of the vocal cords possible small hematoma as cause of stridor.  Also underwent ENT evaluation and apparently refused any tracheostomy at that time.  I was called early on the morning of 11/02/2019 to evaluate the patient with worsening hypoxemia and obtundation.  On my arrival she was on BiPAP no was rapidly turned to 100% FiO2 with oxygen saturations being maintained around 90%.  I consulted anesthesia to intubate because she was considered to be a difficult intubation possibly requiring bedside tracheostomy.  After intubation her oxygen saturations were in the mid to low 90s without good explanation.  There was blood from her OG tube and also a small amount of blood initially from her ET tube.  I was called back to the bedside about 30 minutes later with progressive desaturation and hypoxemia.  There was a large amounts of blood coming from the ET tube.  Shortly thereafter she developed PEA and was resuscitated with multiple rounds of epinephrine and bicarbonate along with CPR.  At this point she had developed massive hemoptysis flooding the ET tube.  Subsequently I performed bronchoscopy which showed slow oozing from all visible airways could not be controlled with ice water lavage.  Central line was also placed at that time.  Patient had yet another PEA arrest that she was resuscitated from but her oxygen saturations never really returned above 80%.  Predominantly this was a hypoxemic issue presumably secondary to massive diffuse alveolar hemorrhage possibly as a result of pulmonary  embolus although she was on Lovenox versus post Covid hemorrhage.  I had 2 separate conversations with her next of kin her sister Weyman Rodney.  With the second conversation I expressed to her that this was not going to be retrievable situation and at that point we decided to continue current therapy with nothing more aggressive no further CPR and to continue sedation.  Patient went asystolic at 3:40 AM 2020-01-27.  Cause of death:  massive hemoptysis  Contributing factors:  COVID-19 positive test (U07.1, COVID-19) with Acute Pneumonia (J12.89, Other viral pneumonia) (If respiratory failure or sepsis present, add as separate assessment)

## 2019-11-03 NOTE — Progress Notes (Signed)
3 cell phones, 1 ipad, 2 purses, 2 wallets, 1 set of keys and other personal belongings's sent down with pt in body bag to the morgue.

## 2019-11-03 NOTE — Procedures (Signed)
Bronchoscopy Procedure Note Summer Guzman [id number] [birth year]  Procedure: Bronchoscopy Indications: Diagnostic evaluation of the airways with massive hemoptysis  Procedure Details Consent: Performed emergently due to acute massive hemoptysis Time Out: Verified patient identification, verified procedure, site/side was marked, verified correct patient position, special equipment/implants available, medications/allergies/relevent history reviewed, required imaging and test results available.  Performed  In preparation for procedure, patient was given 100% FiO2 and bronchoscope lubricated. Sedation: propofol  Airway entered and the following bronchi were examined: RUL, RML, RLL, LUL and LLL.   Procedures performed: Brushings performed Bronchoscope removed.   Blood from all airways was noted, tried to control it with ice water lavage to no avail.  Patient was hypotensisve and hypoxemia prior during and after procedure, prognosis poor.  Evaluation Hemodynamic Status: Persistent hypotension treated with pressors; O2 sats: in the 80% saturation prior during and after procedure Patient's Current Condition: unstable Specimens:  None Complications: No apparent complications Patient did tolerate procedure well.   Norman Clay 10/09/2019

## 2019-11-03 NOTE — Progress Notes (Signed)
Patient developed increased labored respirations with respiratory rate increasing to 30-40/min and sats were marginal at 85-90% on bipap. Heart rate also increased to 130-140 bpm. Somner MD notified and Rande Lawman MD came to bedside to evaluate patient. Decision made to intubate patient and anesthesia paged to come intubate patient. Patient was intubated by anesthesia @ 2359. OGT also placed. ETT and OGT placement verified by radiology. Sats remained marginal after intubation 85-88% and bloody drainage noted in ETT and from OGT suction. Somner MD notified about decreased o2 saturatuin on ventilator and Rande Lawman MD returned to bedside to evaluate. PEA was identified on monitor and code blue called and resuscitation efforts initiated @ 0157. Resuscitation efforts ended @ 0204 with return of circulation. Rande Lawman MD spoke to patient's sister, Weyman Rodney about patient's condition.  PEA was identified again at 0257 and resuscitation efforts resumed. Resuscitation efforts ended @ 0305 due to medical futility and patient's code status was changed to DNR. Blood pressures dropped despite NEO gtt infusing at maximum and IV NS boluses given. O2 saturation continued to drop despite mechanical ventilation. Asystole was observed on patient's monitor @ 0340. Heart beat unable to be auscultated and pulse unable to be palpated by myself or charge nurse Brynda Peon. No spontaneous respirations noted. MD notified.  Patient's sister, Weyman Rodney notified of patient expiring. She stated that she is in [place] and doesn't know when she can make it here.

## 2019-11-03 NOTE — Progress Notes (Signed)
Chaplain responded to Code Blue.  Patient revived prior to arrival. Chaplain not needed.  Did not don PPE.  Vernell Morgans Chaplain Resident

## 2019-11-03 NOTE — Progress Notes (Signed)
eLink Physician-Brief Progress Note Patient Name: Summer Guzman DOB: June 18, 1966 MRN: [id number]   Date of Service  October 24, 2019  HPI/Events of Note  Agitation - RR = 49. Request for bilateral soft wrist restraints and additional sedation.  eICU Interventions  Will order: 1. Fentanyl IV infusion. Titrate to RASS = 0 to -1. 2. Bilateral soft wrist restraints.      Intervention Category Major Interventions: Delirium, psychosis, severe agitation - evaluation and management  [name] October 24, 2019, 12:33 AM

## 2019-11-03 DEATH — deceased

## 2021-08-23 IMAGING — DX DG CHEST 1V PORT
1 series · 1 of 1 positions shown · non-contrast
Comparison: September 23, 2019

CLINICAL DATA: Hypoxia.  [id number] positive

EXAM:
PORTABLE CHEST 1 VIEW

[chest ap]
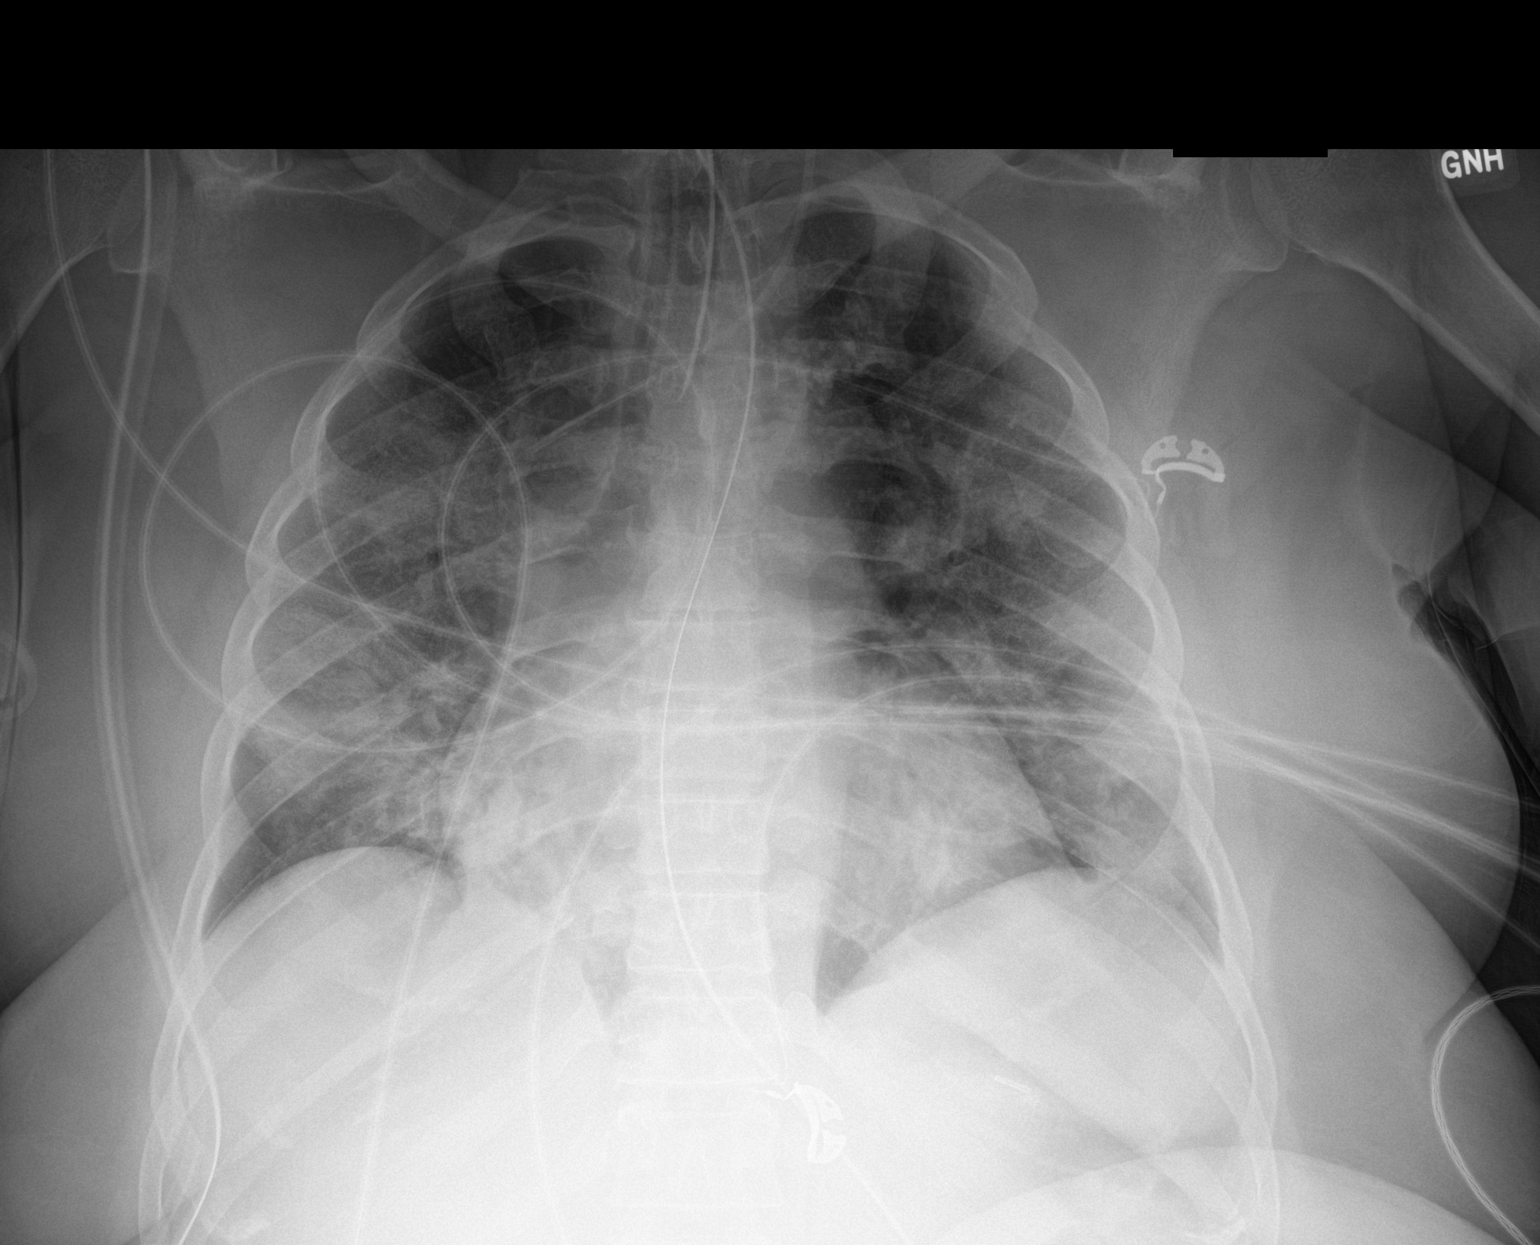

[1 of 1 positions shown; findings below may reference images not displayed]

FINDINGS: Endotracheal tube tip is 3.1 cm above the carina. Central catheter
tip is at the junction of the left innominate vein and superior vena
cava. Nasogastric tube tip and side port are below the diaphragm. No
pneumothorax. There is airspace opacity in each lung base as well as
in each upper lobe, slightly more on the right than on the left.
Heart size and pulmonary vascularity are normal. No adenopathy. No
bone lesions.
IMPRESSION: Tube and catheter positions as described without pneumothorax.
Multifocal pneumonia, similar to 1 day prior. Stable cardiac
silhouette.

## 2021-08-24 IMAGING — DX DG CHEST 1V PORT
1 series · 1 of 1 positions shown · non-contrast
Comparison: 09/24/2019.

CLINICAL DATA: Respiratory failure.

EXAM:
PORTABLE CHEST 1 VIEW

[chest ap]
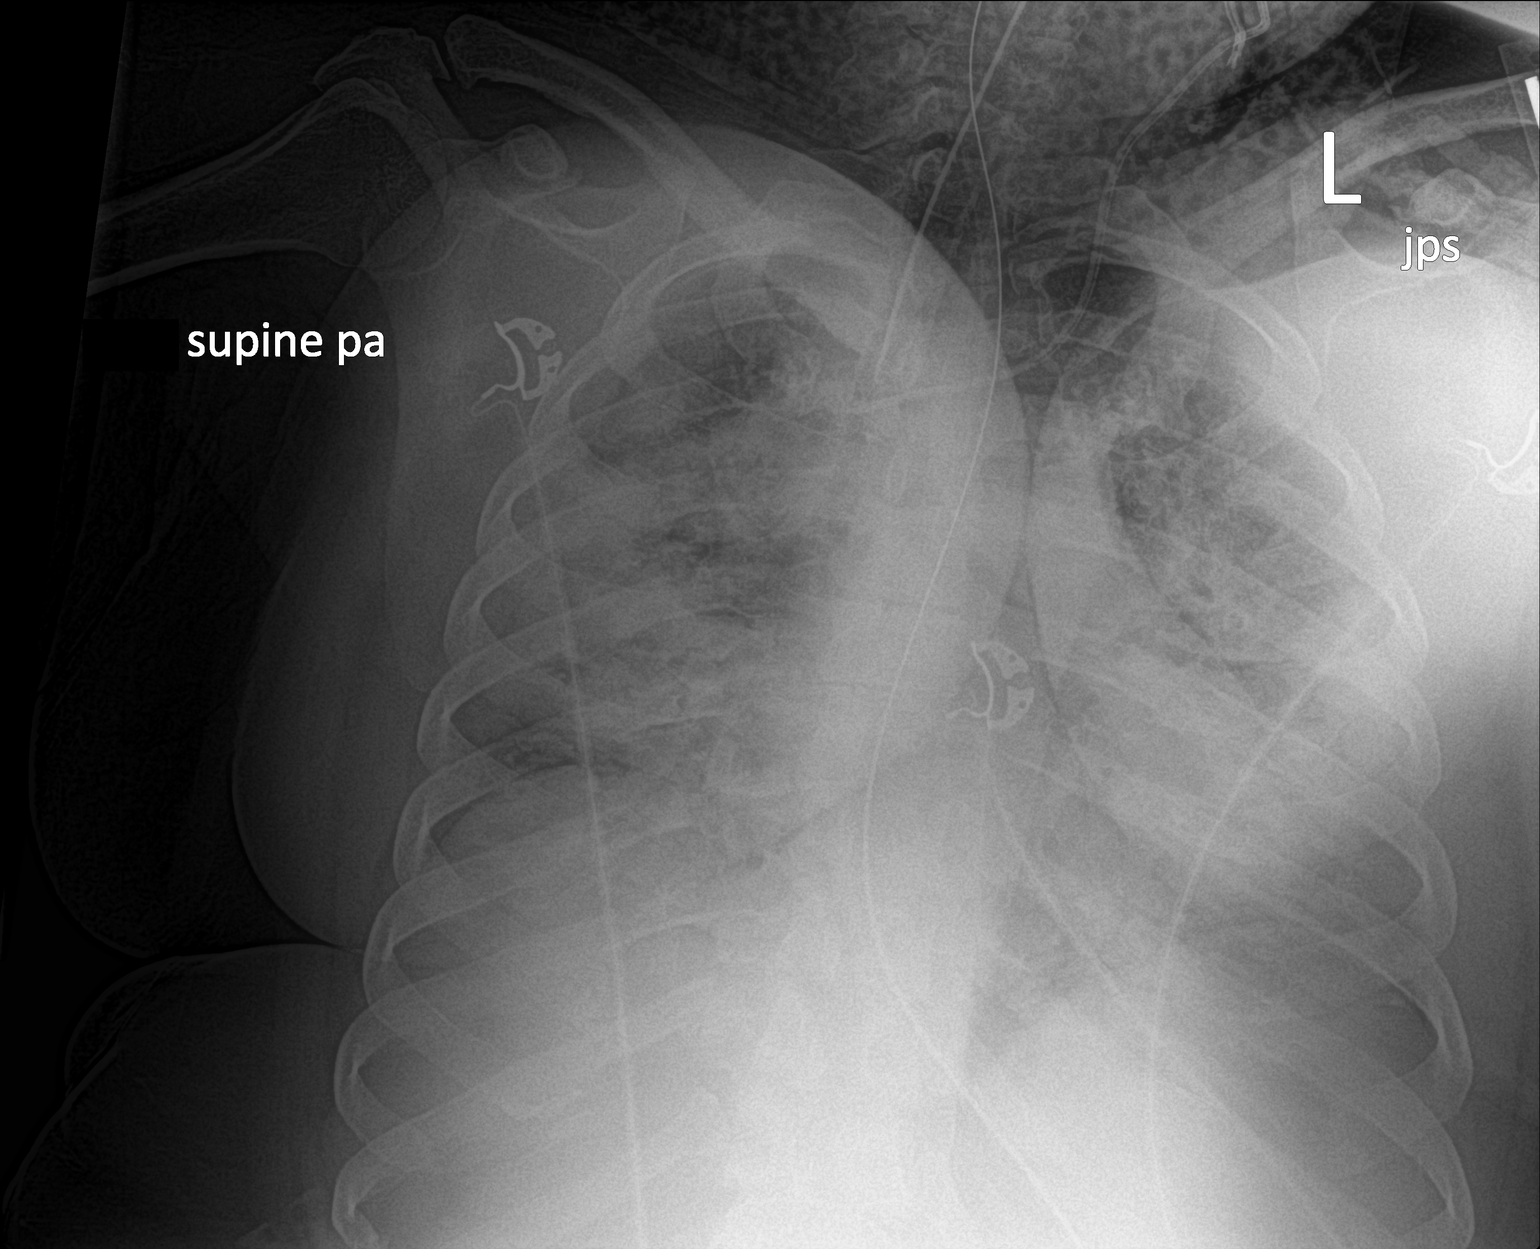

[1 of 1 positions shown; findings below may reference images not displayed]

FINDINGS: Endotracheal tube, left IJ line, NG tube in stable position. Heart
size normal. Diffuse severe bilateral pulmonary infiltrates/edema,
progressed from prior exam. No pleural effusion or pneumothorax.
IMPRESSION: 1.  Lines and tubes in stable position.

2. Diffuse severe bilateral pulmonary infiltrates/edema, progressed
from prior exam.

## 2021-08-29 IMAGING — DX DG CHEST 1V PORT
1 series · 1 of 1 positions shown · non-contrast
Comparison: September 28, 2019

CLINICAL DATA: COVID positive pneumonia.

EXAM:
PORTABLE CHEST 1 VIEW

[chest ap]
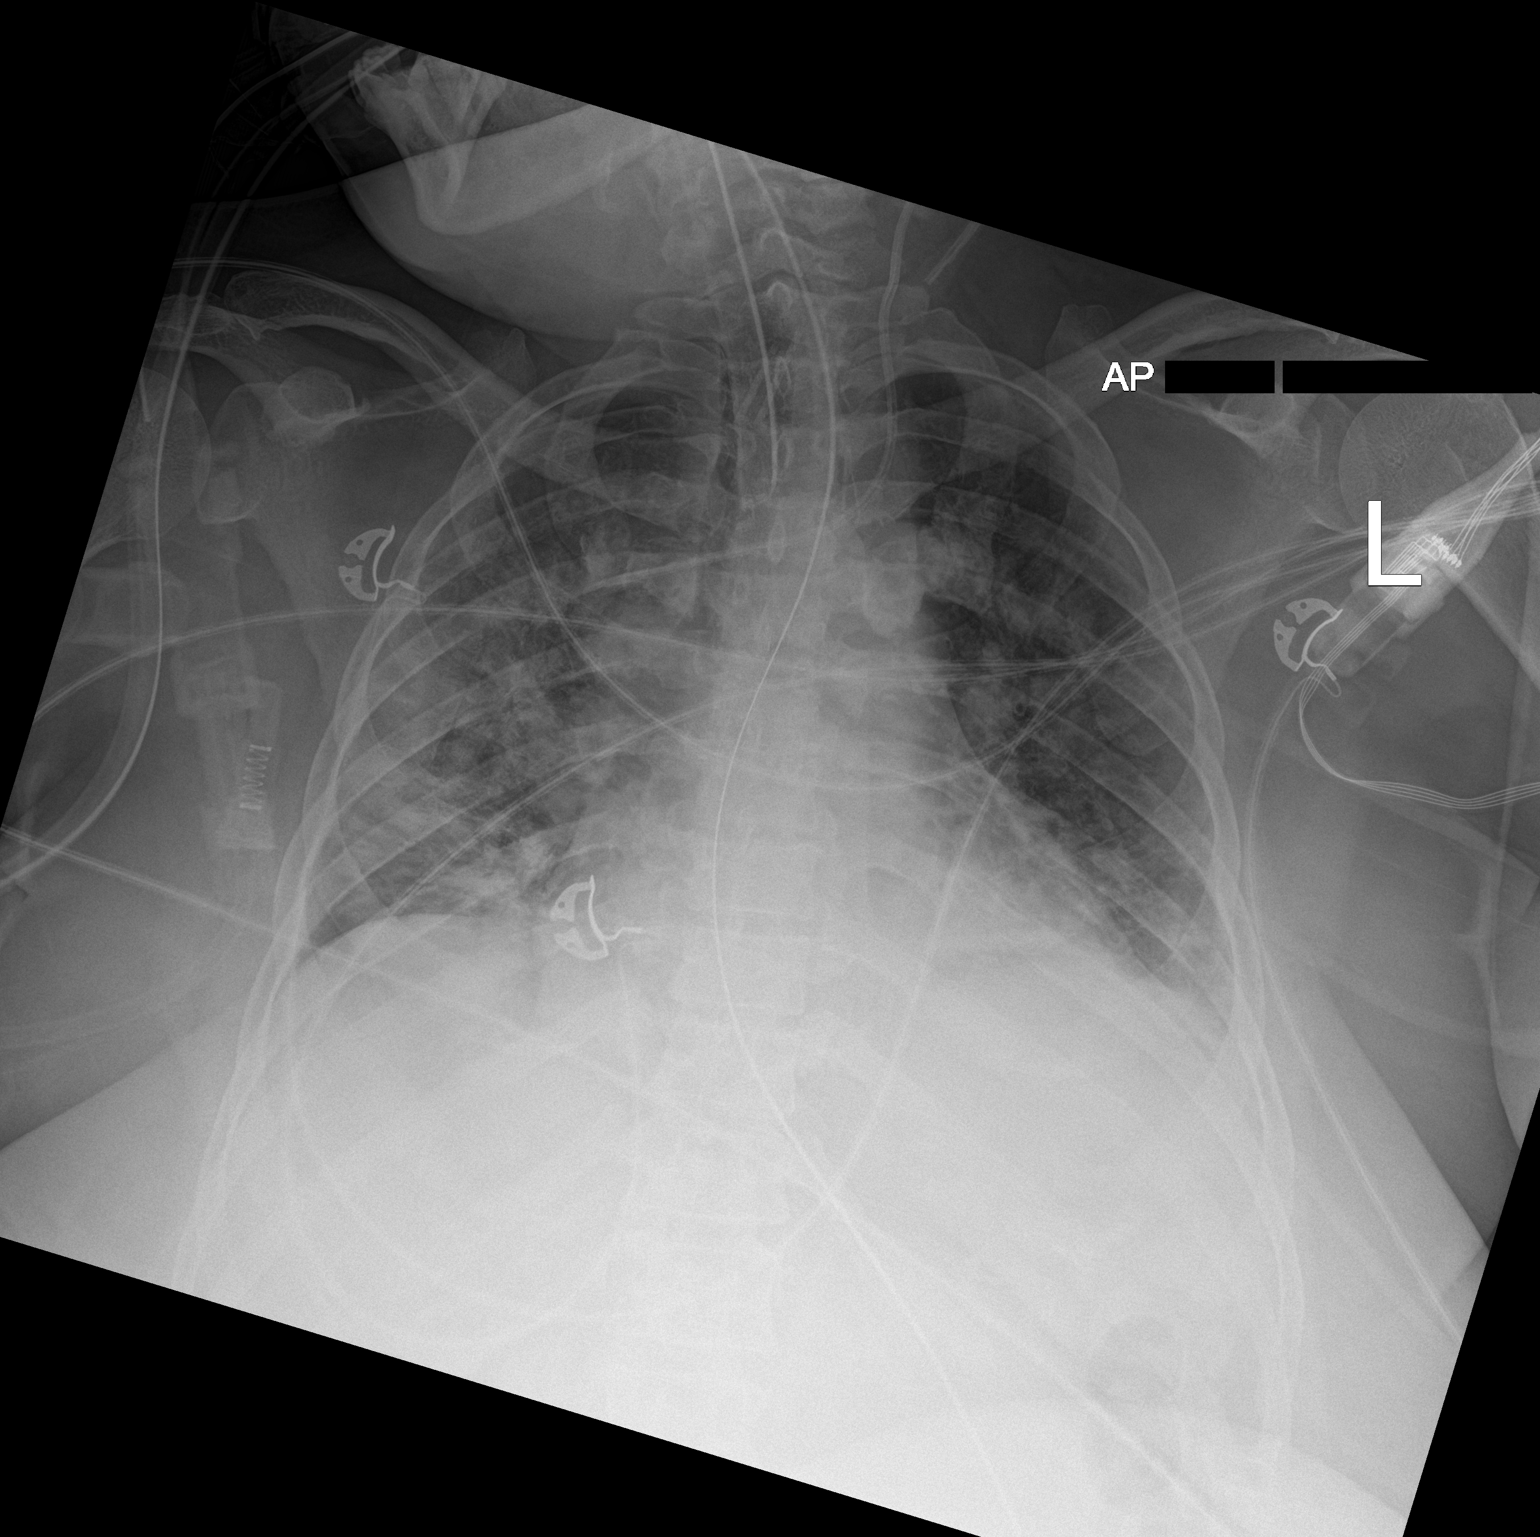

[1 of 1 positions shown; findings below may reference images not displayed]

FINDINGS: Endotracheal tube, nasogastric tube and left central venous line are
unchanged. Patchy opacities are identified throughout bilateral
lungs unchanged. There is probably a small left pleural effusion.
The bony structures are normal.
IMPRESSION: Patchy opacities identified throughout bilateral lungs unchanged
compared to prior exam.

## 2021-09-03 IMAGING — DX DG CHEST 1V PORT
1 series · 1 of 1 positions shown · non-contrast
Comparison: Chest x-ray dated 10/02/2019.

CLINICAL DATA: Hypoxemia. COVID positive.

EXAM:
PORTABLE CHEST 1 VIEW

[chest ap]
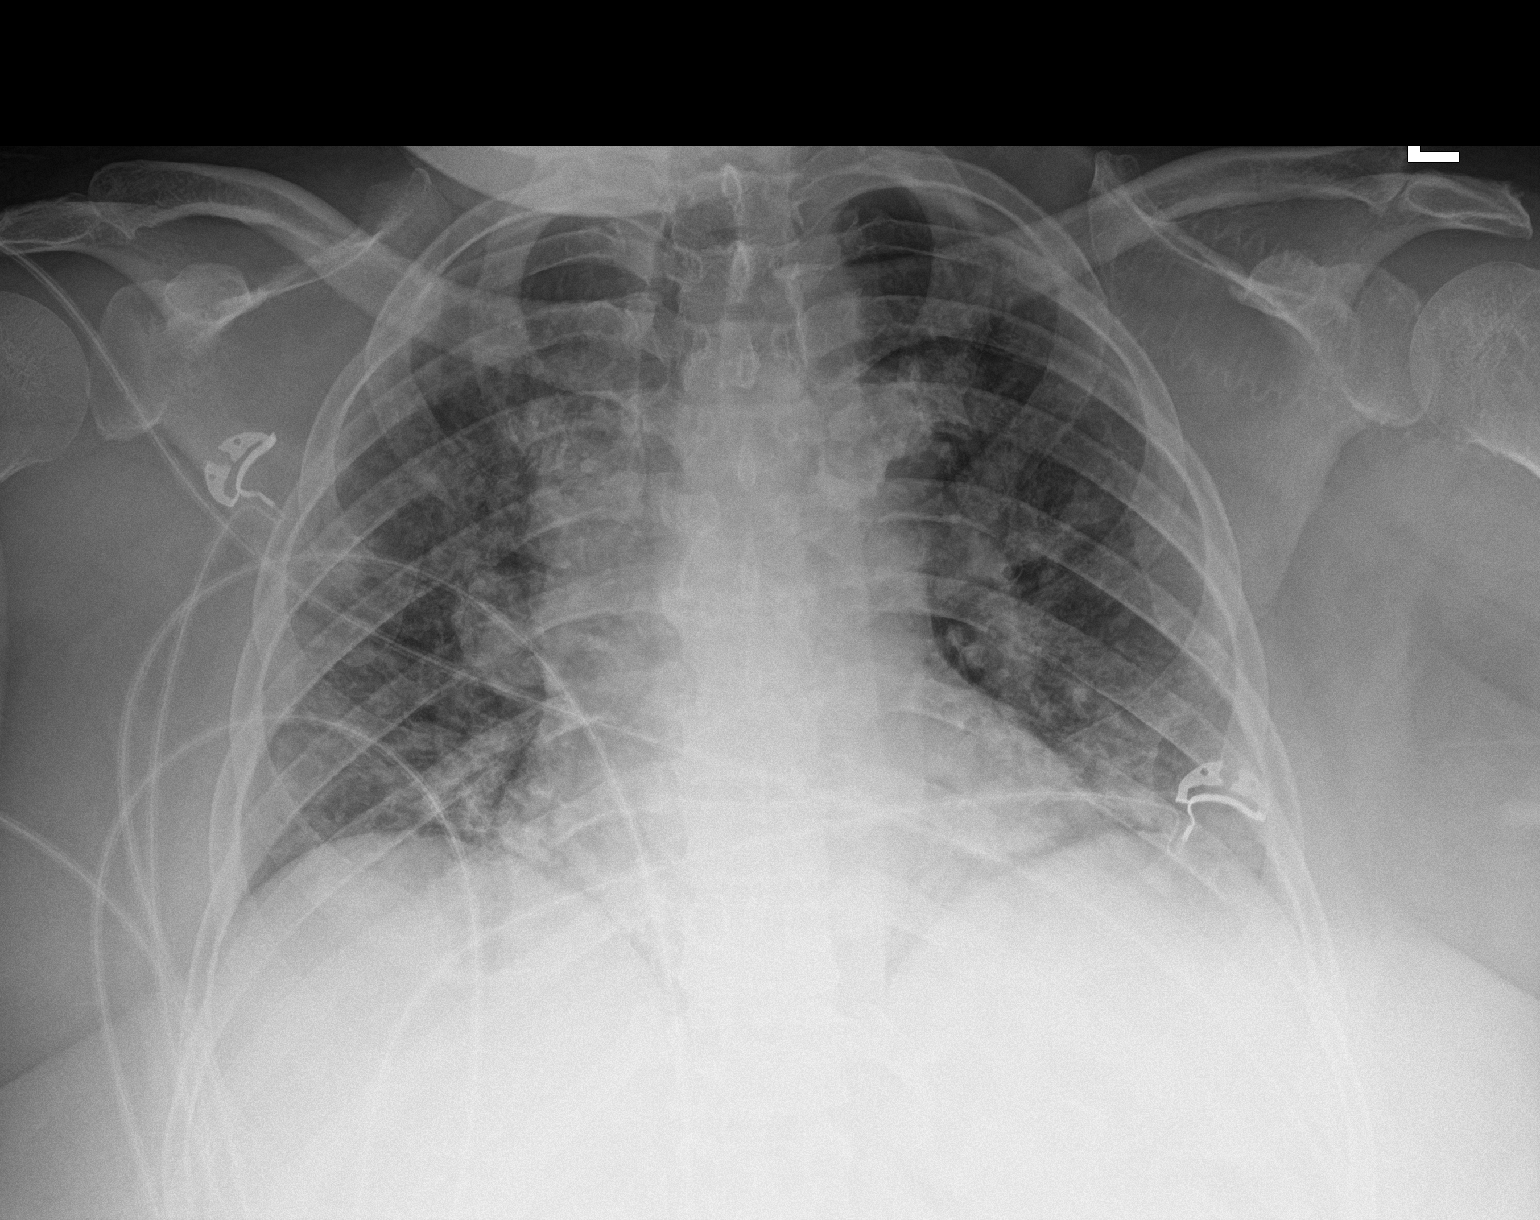

[1 of 1 positions shown; findings below may reference images not displayed]

FINDINGS: Endotracheal tube has been removed. Enteric tube has been removed.
Heart size and mediastinal contours are stable. Interstitial
opacities appear improved bilaterally. No new lung findings. No
pleural effusion or pneumothorax is seen.
IMPRESSION: 1. Improved aeration bilaterally.
2. Interval removal of the endotracheal tube and enteric tube.
